# Patient Record
Sex: Female | Born: 1940
Health system: Southern US, Community
[De-identification: ages and names within clinical notes are randomized; demographics above are authoritative.]

## PROBLEM LIST (undated history)

## (undated) DIAGNOSIS — Y92009 Unspecified place in unspecified non-institutional (private) residence as the place of occurrence of the external cause: Secondary | ICD-10-CM

## (undated) DIAGNOSIS — B029 Zoster without complications: Secondary | ICD-10-CM

## (undated) DIAGNOSIS — R51 Headache: Secondary | ICD-10-CM

## (undated) DIAGNOSIS — R519 Headache, unspecified: Secondary | ICD-10-CM

## (undated) DIAGNOSIS — W19XXXA Unspecified fall, initial encounter: Secondary | ICD-10-CM

## (undated) HISTORY — DX: Unspecified place in unspecified non-institutional (private) residence as the place of occurrence of the external cause: Y92.009

## (undated) HISTORY — PX: ABDOMINAL HYSTERECTOMY: SHX81

## (undated) HISTORY — DX: Unspecified fall, initial encounter: W19.XXXA

## (undated) HISTORY — DX: Headache: R51

## (undated) HISTORY — PX: CHOLECYSTECTOMY: SHX55

## (undated) HISTORY — DX: Headache, unspecified: R51.9

## (undated) HISTORY — PX: TOE SURGERY: SHX1073

---

## 2006-06-01 LAB — HM COLONOSCOPY

## 2011-11-05 ENCOUNTER — Emergency Department (HOSPITAL_BASED_OUTPATIENT_CLINIC_OR_DEPARTMENT_OTHER): Payer: BC Managed Care – PPO

## 2011-11-05 ENCOUNTER — Encounter (HOSPITAL_BASED_OUTPATIENT_CLINIC_OR_DEPARTMENT_OTHER): Payer: Self-pay | Admitting: Emergency Medicine

## 2011-11-05 ENCOUNTER — Emergency Department (HOSPITAL_BASED_OUTPATIENT_CLINIC_OR_DEPARTMENT_OTHER)
Admission: EM | Admit: 2011-11-05 | Discharge: 2011-11-05 | Disposition: A | Discharge status: Home / Self Care | Payer: BC Managed Care – PPO | Attending: Emergency Medicine | Admitting: Emergency Medicine

## 2011-11-05 DIAGNOSIS — W540XXA Bitten by dog, initial encounter: Secondary | ICD-10-CM | POA: Insufficient documentation

## 2011-11-05 DIAGNOSIS — S61409A Unspecified open wound of unspecified hand, initial encounter: Secondary | ICD-10-CM | POA: Insufficient documentation

## 2011-11-05 DIAGNOSIS — Y92009 Unspecified place in unspecified non-institutional (private) residence as the place of occurrence of the external cause: Secondary | ICD-10-CM | POA: Insufficient documentation

## 2011-11-05 DIAGNOSIS — T148XXA Other injury of unspecified body region, initial encounter: Secondary | ICD-10-CM

## 2011-11-05 HISTORY — DX: Zoster without complications: B02.9

## 2011-11-05 MED ORDER — AMOXICILLIN-POT CLAVULANATE 875-125 MG PO TABS: 1.0000 | ORAL_TABLET | Freq: Two times a day (BID) | ORAL | Status: AC

## 2011-11-05 MED ORDER — TETANUS-DIPHTH-ACELL PERTUSSIS 5-2.5-18.5 LF-MCG/0.5 IM SUSP
0.5000 mL | Freq: Once | INTRAMUSCULAR | Status: AC
  Administered 2011-11-05: 0.5 mL via INTRAMUSCULAR
  Filled 2011-11-05: qty 0.5

## 2011-11-05 NOTE — ED Provider Notes (Signed)
History     CSN: [id number]  Arrival date & time 11/05/11  2022   First MD Initiated Contact with Patient 11/05/11 2158      Chief Complaint  Patient presents with  . Animal Bite    (Consider location/radiation/quality/duration/timing/severity/associated sxs/prior treatment) Patient is a 71 y.o. female presenting with hand injury. The history is provided by the patient.  Hand Injury  The incident occurred 1 to 2 hours ago. The incident occurred at home. Injury mechanism: a dog bite. The pain is present in the left hand. The quality of the pain is described as aching. The pain is at a severity of 4/10. The pain is moderate. It is unknown if a foreign body is present. She has tried nothing for the symptoms.  Ptreports her neighbors dog bite her hand.  Pt has swelling to 3rd finger,    Past Medical History  Diagnosis Date  . Shingles     Past Surgical History  Procedure Date  . Abdominal hysterectomy   . Cholecystectomy     History reviewed. No pertinent family history.  History  Substance Use Topics  . Smoking status: Never Smoker   . Smokeless tobacco: Not on file  . Alcohol Use: No    OB History    Grav Para Term Preterm Abortions TAB SAB Ect Mult Living                  Review of Systems  Musculoskeletal: Positive for joint swelling.  Skin: Positive for wound.  All other systems reviewed and are negative.    Allergies  Review of patient's allergies indicates no known allergies.  Home Medications   Current Outpatient Rx  Name Route Sig Dispense Refill  . DIPHENHYDRAMINE-APAP (SLEEP) 25-500 MG PO TABS Oral Take 1 tablet by mouth at bedtime as needed. For sleep.    Marland Kitchen GABAPENTIN 300 MG PO CAPS Oral Take 300 mg by mouth 3 (three) times daily.      BP 121/56  Pulse 96  Resp 14  Ht 5\' 1"  (1.549 m)  Wt 130 lb (58.968 kg)  BMI 24.56 kg/m2  SpO2 100%  Physical Exam  Nursing note and vitals reviewed. Constitutional: She is oriented to person, place, and  time. She appears well-developed and well-nourished.  HENT:  Head: Normocephalic.  Musculoskeletal: She exhibits tenderness.       punture wound left hand at base of 3rd finger, no gapping.  Neurological: She is alert and oriented to person, place, and time. She has normal reflexes.  Skin: There is erythema.  Psychiatric: She has a normal mood and affect.    ED Course  Procedures (including critical care time)  Labs Reviewed - No data to display No results found.   1. Animal bite   No results found for this or any previous visit. Dg Hand Complete Left  11/05/2011  *RADIOLOGY REPORT*  Clinical Data: Animal bite to left thumb and index finger  LEFT HAND - COMPLETE 3+ VIEW  Comparison: None  Findings: Osseous demineralization. Joint spaces preserved. No acute fracture, dislocation, or bone destruction. No radiopaque foreign body identified. Tiny calcific densities at the IP joint of the left thumb are old in appearance.  IMPRESSION: No definite acute osseous abnormalities.   Original Report Authenticated By: Lollie Marrow, M.D.        MDM  Pt given tetanus,  rx for augmentin,         Elson Areas, PA 11/05/11 2205  Lonia Skinner [place],  PA 11/05/11 2224

## 2011-11-05 NOTE — ED Notes (Signed)
Dog bite to left hand 

## 2011-11-07 NOTE — ED Provider Notes (Signed)
Medical screening examination/treatment/procedure(s) were conducted as a shared visit with non-physician practitioner(s) and myself.  I personally evaluated the patient during the encounter   Cyndra Numbers, MD 11/07/11 [phone]

## 2015-03-09 ENCOUNTER — Encounter (HOSPITAL_BASED_OUTPATIENT_CLINIC_OR_DEPARTMENT_OTHER): Payer: Self-pay | Admitting: Emergency Medicine

## 2015-03-09 ENCOUNTER — Emergency Department (HOSPITAL_BASED_OUTPATIENT_CLINIC_OR_DEPARTMENT_OTHER): Payer: PPO

## 2015-03-09 ENCOUNTER — Emergency Department (HOSPITAL_BASED_OUTPATIENT_CLINIC_OR_DEPARTMENT_OTHER)
Admission: EM | Admit: 2015-03-09 | Discharge: 2015-03-09 | Disposition: A | Discharge status: Home / Self Care | Payer: PPO | Attending: Emergency Medicine | Admitting: Emergency Medicine

## 2015-03-09 DIAGNOSIS — Y998 Other external cause status: Secondary | ICD-10-CM | POA: Insufficient documentation

## 2015-03-09 DIAGNOSIS — R112 Nausea with vomiting, unspecified: Secondary | ICD-10-CM | POA: Diagnosis not present

## 2015-03-09 DIAGNOSIS — Y92002 Bathroom of unspecified non-institutional (private) residence single-family (private) house as the place of occurrence of the external cause: Secondary | ICD-10-CM | POA: Insufficient documentation

## 2015-03-09 DIAGNOSIS — W1839XA Other fall on same level, initial encounter: Secondary | ICD-10-CM | POA: Insufficient documentation

## 2015-03-09 DIAGNOSIS — Y9389 Activity, other specified: Secondary | ICD-10-CM | POA: Insufficient documentation

## 2015-03-09 DIAGNOSIS — R197 Diarrhea, unspecified: Secondary | ICD-10-CM | POA: Diagnosis not present

## 2015-03-09 DIAGNOSIS — S0012XA Contusion of left eyelid and periocular area, initial encounter: Secondary | ICD-10-CM | POA: Insufficient documentation

## 2015-03-09 DIAGNOSIS — S0990XA Unspecified injury of head, initial encounter: Secondary | ICD-10-CM | POA: Insufficient documentation

## 2015-03-09 DIAGNOSIS — R1033 Periumbilical pain: Secondary | ICD-10-CM | POA: Diagnosis not present

## 2015-03-09 DIAGNOSIS — R55 Syncope and collapse: Secondary | ICD-10-CM | POA: Insufficient documentation

## 2015-03-09 DIAGNOSIS — Z8619 Personal history of other infectious and parasitic diseases: Secondary | ICD-10-CM | POA: Diagnosis not present

## 2015-03-09 DIAGNOSIS — Z79899 Other long term (current) drug therapy: Secondary | ICD-10-CM | POA: Diagnosis not present

## 2015-03-09 DIAGNOSIS — S0512XA Contusion of eyeball and orbital tissues, left eye, initial encounter: Secondary | ICD-10-CM | POA: Diagnosis not present

## 2015-03-09 LAB — LIPASE, BLOOD: Lipase: 26 U/L (ref 11–51)

## 2015-03-09 LAB — COMPREHENSIVE METABOLIC PANEL
ALK PHOS: 72 U/L (ref 38–126)
ALT: 32 U/L (ref 14–54)
AST: 35 U/L (ref 15–41)
Albumin: 4.2 g/dL (ref 3.5–5.0)
Anion gap: 6 (ref 5–15)
BUN: 17 mg/dL (ref 6–20)
CALCIUM: 9 mg/dL (ref 8.9–10.3)
CO2: 25 mmol/L (ref 22–32)
CREATININE: 0.88 mg/dL (ref 0.44–1.00)
Chloride: 106 mmol/L (ref 101–111)
GFR calc non Af Amer: >60 mL/min (ref >60)
GLUCOSE: 112 mg/dL — AB (ref 65–99)
Potassium: 3.8 mmol/L (ref 3.5–5.1)
SODIUM: 137 mmol/L (ref 135–145)
Total Bilirubin: 1 mg/dL (ref 0.3–1.2)
Total Protein: 7.1 g/dL (ref 6.5–8.1)

## 2015-03-09 LAB — CBC WITH DIFFERENTIAL/PLATELET
BASOS ABS: 0 10*3/uL (ref 0.0–0.1)
Basophils Relative: 0 %
EOS ABS: 0 10*3/uL (ref 0.0–0.7)
Eosinophils Relative: 0 %
HCT: 45.2 % (ref 36.0–46.0)
HEMOGLOBIN: 14.6 g/dL (ref 12.0–15.0)
LYMPHS ABS: 0.6 10*3/uL — AB (ref 0.7–4.0)
LYMPHS PCT: 6 %
MCH: 28.3 pg (ref 26.0–34.0)
MCHC: 32.3 g/dL (ref 30.0–36.0)
MCV: 87.6 fL (ref 78.0–100.0)
Monocytes Absolute: 0.5 10*3/uL (ref 0.1–1.0)
Monocytes Relative: 6 %
NEUTROS PCT: 88 %
Neutro Abs: 7.8 10*3/uL — ABNORMAL HIGH (ref 1.7–7.7)
Platelets: 227 10*3/uL (ref 150–400)
RBC: 5.16 MIL/uL — AB (ref 3.87–5.11)
RDW: 14.3 % (ref 11.5–15.5)
WBC: 8.9 10*3/uL (ref 4.0–10.5)

## 2015-03-09 LAB — TROPONIN I

## 2015-03-09 MED ORDER — ONDANSETRON HCL 4 MG PO TABS: 4.0000 mg | ORAL_TABLET | Freq: Four times a day (QID) | ORAL | Status: DC

## 2015-03-09 MED ORDER — SODIUM CHLORIDE 0.9 % IV BOLUS (SEPSIS)
1000.0000 mL | Freq: Once | INTRAVENOUS | Status: AC
  Administered 2015-03-09: 1000 mL via INTRAVENOUS

## 2015-03-09 MED ORDER — ACETAMINOPHEN 325 MG PO TABS
650.0000 mg | ORAL_TABLET | Freq: Once | ORAL | Status: AC
  Administered 2015-03-09: 650 mg via ORAL
  Filled 2015-03-09: qty 2

## 2015-03-09 MED ORDER — ONDANSETRON HCL 4 MG/2ML IJ SOLN
4.0000 mg | Freq: Once | INTRAMUSCULAR | Status: AC
  Administered 2015-03-09: 4 mg via INTRAVENOUS
  Filled 2015-03-09: qty 2

## 2015-03-09 NOTE — ED Notes (Signed)
Pt in c/o abd pain and n/v/d onset last night. States was on the toilet today and passed out and woke up on the floor, bruising noted to L eye. Denies taking blood thinners.

## 2015-03-09 NOTE — ED Provider Notes (Signed)
CSN: [id number]     Arrival date & time 03/09/15  1704 History   First MD Initiated Contact with Patient 03/09/15 1711     Chief Complaint  Patient presents with  . Emesis  . Fall     (Consider location/radiation/quality/duration/timing/severity/associated sxs/prior Treatment) HPI Comments: Patient is a 75 year old healthy female who takes no medications and presents today with 2 complaints. Approximately 10 PM last night patient started to have vomiting which continued throughout the night. At 6 AM this morning when she was in the bathroom sitting on the toilet vomiting into a trash can she suddenly became sweaty, lightheaded and had a syncopal events causing her to hit her left eye on the vanity as she fell to the floor. Since that time she has had ongoing vomiting which ceased approximately 2 hours ago however now she is also having diarrhea. She has mild diffuse abdominal soreness but denies any localized pain. No blood in the emesis or diarrhea. She is complaining of a headache that is generalized across the floor head but denies vision changes, numbness, weakness, chest pain, shortness of breath, palpitations. She has had no difficulty ambulating.  Patient is a 75 y.o. female presenting with vomiting and fall. The history is provided by the patient.  Emesis Fall    Past Medical History  Diagnosis Date  . Shingles    Past Surgical History  Procedure Laterality Date  . Abdominal hysterectomy    . Cholecystectomy     History reviewed. No pertinent family history. Social History  Substance Use Topics  . Smoking status: Never Smoker   . Smokeless tobacco: None  . Alcohol Use: No   OB History    No data available     Review of Systems  Gastrointestinal: Positive for vomiting.  All other systems reviewed and are negative.     Allergies  Review of patient's allergies indicates no known allergies.  Home Medications   Prior to Admission medications   Medication Sig  Start Date End Date Taking? Authorizing Provider  diphenhydramine-acetaminophen (TYLENOL PM) 25-500 MG TABS Take 1 tablet by mouth at bedtime as needed. For sleep.    Historical Provider, MD  gabapentin (NEURONTIN) 300 MG capsule Take 300 mg by mouth 3 (three) times daily.    Historical Provider, MD   BP 116/64 mmHg  Pulse 77  Temp(Src) 97.9 F (36.6 C) (Oral)  Resp 9  Ht 5\' 1"  (1.549 m)  Wt 133 lb (60.328 kg)  BMI 25.14 kg/m2  SpO2 97% Physical Exam  Constitutional: She is oriented to person, place, and time. She appears well-developed and well-nourished. No distress.  HENT:  Head: Normocephalic. Head is with contusion.    Mouth/Throat: Oropharynx is clear and moist.  Tenderness and ecchymosis over the left orbit  Eyes: Conjunctivae and EOM are normal. Pupils are equal, round, and reactive to light.  Neck: Normal range of motion. Neck supple. No spinous process tenderness and no muscular tenderness present.  Cardiovascular: Normal rate, regular rhythm and intact distal pulses.   No murmur heard. Pulmonary/Chest: Effort normal and breath sounds normal. No respiratory distress. She has no wheezes. She has no rales.  Abdominal: Soft. She exhibits no distension. There is tenderness. There is no rebound and no guarding.  Abdomen is soft and mild periumbilical tenderness but rebound or guarding  Musculoskeletal: Normal range of motion. She exhibits no edema or tenderness.  Neurological: She is alert and oriented to person, place, and time.  Skin: Skin is warm and dry.  No rash noted. No erythema.  Psychiatric: She has a normal mood and affect. Her behavior is normal.  Nursing note and vitals reviewed.   ED Course  Procedures (including critical care time) Labs Review Labs Reviewed  CBC WITH DIFFERENTIAL/PLATELET - Abnormal; Notable for the following:    RBC 5.16 (*)    Neutro Abs 7.8 (*)    Lymphs Abs 0.6 (*)    All other components within normal limits  COMPREHENSIVE METABOLIC  PANEL - Abnormal; Notable for the following:    Glucose, Bld 112 (*)    All other components within normal limits  LIPASE, BLOOD  TROPONIN I    Imaging Review Ct Head Wo Contrast  03/09/2015  CLINICAL DATA:  Patient passed out today falling to the floor bruising the left orbital region. EXAM: CT HEAD WITHOUT CONTRAST CT MAXILLOFACIAL WITHOUT CONTRAST TECHNIQUE: Multidetector CT imaging of the head and maxillofacial structures were performed using the standard protocol without intravenous contrast. Multiplanar CT image reconstructions of the maxillofacial structures were also generated. COMPARISON:  None. FINDINGS: CT HEAD FINDINGS The ventricles are normal in size and configuration. There are no parenchymal masses or mass effect, no evidence of infarct, no extra-axial masses or abnormal fluid collections and no intracranial hemorrhage. The visualized sinuses and mastoid air cells clear. No skull fracture. CT MAXILLOFACIAL FINDINGS There is mild left periorbital, preseptal soft tissue swelling. No abnormality of the left globe or postseptal orbit. There are no fractures. The sinuses, middle ear cavities and mastoid air cells are clear. No soft tissue masses or adenopathy. IMPRESSION: HEAD CT:  No intracranial abnormality.  No skull fracture. MAXILLOFACIAL CT:  No fractures. Electronically Signed   By: Amie Portland M.D.   On: 03/09/2015 18:08   Ct Maxillofacial Wo Cm  03/09/2015  CLINICAL DATA:  Patient passed out today falling to the floor bruising the left orbital region. EXAM: CT HEAD WITHOUT CONTRAST CT MAXILLOFACIAL WITHOUT CONTRAST TECHNIQUE: Multidetector CT imaging of the head and maxillofacial structures were performed using the standard protocol without intravenous contrast. Multiplanar CT image reconstructions of the maxillofacial structures were also generated. COMPARISON:  None. FINDINGS: CT HEAD FINDINGS The ventricles are normal in size and configuration. There are no parenchymal masses or  mass effect, no evidence of infarct, no extra-axial masses or abnormal fluid collections and no intracranial hemorrhage. The visualized sinuses and mastoid air cells clear. No skull fracture. CT MAXILLOFACIAL FINDINGS There is mild left periorbital, preseptal soft tissue swelling. No abnormality of the left globe or postseptal orbit. There are no fractures. The sinuses, middle ear cavities and mastoid air cells are clear. No soft tissue masses or adenopathy. IMPRESSION: HEAD CT:  No intracranial abnormality.  No skull fracture. MAXILLOFACIAL CT:  No fractures. Electronically Signed   By: Amie Portland M.D.   On: 03/09/2015 18:08   I have personally reviewed and evaluated these images and lab results as part of my medical decision-making.   EKG Interpretation   Date/Time:  Saturday March 09 2015 17:27:44 EST Ventricular Rate:  85 PR Interval:  146 QRS Duration: 104 QT Interval:  381 QTC Calculation: 453 R Axis:   -56 Text Interpretation:  Sinus rhythm Incomplete RBBB and LAFB Low voltage,  precordial leads Consider right ventricular hypertrophy No previous  tracing Confirmed by Anitra Lauth  MD, Alphonzo Lemmings ([id number]) on 03/09/2015 5:35:43 PM      MDM   Final diagnoses:  Vasovagal syncope  Nausea vomiting and diarrhea   Pt with symptoms most consistent with a  viral process with  + sick contacts, vomitting/diarrhea.  Denies bad food exposure and recent travel out of the country.  No recent abx.  No hx concerning for GU pathology or kidney stones.  Pt is awake and alert on exam without peritoneal signs.  Syncope seems to be related to a vagal event while vomiting today.  Denies CP, SOB or palpitations.  Pt is healthy and takes no meds.  Denies any chest pain since the event and no neuro sx.  She is able to ambulate without difficulty and denies any vision changes.  Pt VS wnl.  Pain over the orbit but normal neuro exam.  Pt given IVF and zofran.  CT of the head and face, CBC, CMP, lipase and troponin  pending  6:44 PM Labs and imaging wnl.  Pt improved after fluids and zofran.  Pt tolerating po's and requesting to go home.    Gwyneth SproutWhitney Raevyn Sokol, MD 03/09/15 1845

## 2015-05-21 DIAGNOSIS — Z79899 Other long term (current) drug therapy: Secondary | ICD-10-CM | POA: Diagnosis not present

## 2015-05-21 DIAGNOSIS — I10 Essential (primary) hypertension: Secondary | ICD-10-CM | POA: Diagnosis not present

## 2015-05-21 DIAGNOSIS — E559 Vitamin D deficiency, unspecified: Secondary | ICD-10-CM | POA: Diagnosis not present

## 2015-05-21 DIAGNOSIS — Z0000 Encounter for general adult medical examination without abnormal findings: Secondary | ICD-10-CM | POA: Diagnosis not present

## 2015-05-29 ENCOUNTER — Encounter: Payer: Self-pay | Admitting: Neurology

## 2015-05-29 ENCOUNTER — Ambulatory Visit (INDEPENDENT_AMBULATORY_CARE_PROVIDER_SITE_OTHER): Payer: PPO | Admitting: Neurology

## 2015-05-29 VITALS — BP 120/72 | HR 64 | Resp 14 | Ht 61.0 in | Wt 138.0 lb

## 2015-05-29 DIAGNOSIS — F0781 Postconcussional syndrome: Secondary | ICD-10-CM | POA: Diagnosis not present

## 2015-05-29 DIAGNOSIS — H9192 Unspecified hearing loss, left ear: Secondary | ICD-10-CM | POA: Diagnosis not present

## 2015-05-29 DIAGNOSIS — H8110 Benign paroxysmal vertigo, unspecified ear: Secondary | ICD-10-CM

## 2015-05-29 DIAGNOSIS — G250 Essential tremor: Secondary | ICD-10-CM | POA: Diagnosis not present

## 2015-05-29 DIAGNOSIS — H9202 Otalgia, left ear: Secondary | ICD-10-CM | POA: Diagnosis not present

## 2015-05-29 DIAGNOSIS — R42 Dizziness and giddiness: Secondary | ICD-10-CM | POA: Insufficient documentation

## 2015-05-29 MED ORDER — METHYLPREDNISOLONE 4 MG PO TBPK
ORAL_TABLET | ORAL | Status: DC
Start: 2015-05-29 — End: 2016-06-03

## 2015-05-29 MED ORDER — MECLIZINE HCL 25 MG PO TABS: 25.0000 mg | ORAL_TABLET | Freq: Three times a day (TID) | ORAL | Status: DC | PRN

## 2015-05-29 NOTE — Progress Notes (Signed)
[facility]    Provider:  Dr Lucia GaskinsAhern Referring Provider: Aida PufferLittle, James, MD Primary Care Physician:  Aida PufferLITTLE,JAMES, MD  CC:  Head tremor  HPI:  Caroline Sandoval is a 75 y.o. female here as a referral from Dr. Clarene DukeLittle for head tremors and +romberg. Without significant PMHx.  The head tremors have been going on for years. Not really worsening. Father also had the head tremor. Not in the voice. Sometimes in the hands and handwriting jerky, no resting tremor. She had the flu inJanuary and she passed out and hit her head. She hit the left side of her head and had a black eye. Since then she is dizzy from time to time. If she went to bed and turned over on the right side the room would spin. She mentioned it to Dr. Clarene DukeLittle and noticed it is wobbling. The room spinning is getting better. She occasionally roll over in bed and she gets vertigo but improving. She gets up sometimes in the morning and feel the vertigo and can't walk straight. She is having ear issues and ear aches. She has hearing difficulty. The left ear is bothering her, ear aches. She has not been to ENT to check her inner ear and no recent hearing test.  Worsening over the last several years. She can't hear as well. No noises in the ears. No drainage from the ears. Years ago previous to this incident she had vertigo so bad she had to go to bed, she was given medication and it resolved but never had it again until she had the flu in January and then fell and hit her head because she had the flu and she was dehydrated and sick and fell and hit her head. The vertigo started after hitting her head. Improving. No numbness or tingling in the toes. Balance problems have come after she hit her head with a recent +Romberg in the office. She also had a headache after hitting her head and that has gotten better. She still gets headaches every day concentrated on the left side of the head from where she hit her head.   Reviewed notes, labs  and imaging from outside physicians, which showed:Reviewed dr Fredirick MaudlinLittle's notes. He notes a +Romberg on exam. Familial head tremor and feelings of dizziness.   CT of the head unremarkable. Personally reviewed.  03/09/2015:  CT MAXILLOFACIAL FINDINGS  There is mild left periorbital, preseptal soft tissue swelling. No abnormality of the left globe or postseptal orbit.  There are no fractures. The sinuses, middle ear cavities and mastoid air cells are clear.  No soft tissue masses or adenopathy.  IMPRESSION: HEAD CT: No intracranial abnormality. No skull fracture.  MAXILLOFACIAL CT: No fractures.    Review of Systems: Patient complains of symptoms per HPI as well as the following symptoms:weight gain, fatigue, headache, dizziness. Pertinent negatives per HPI. All others negative.   Social History   Social History  . Marital Status: Married    Spouse Name: N/A  . Number of Children: 2  . Years of Education: College   Occupational History  . Retired     Social History Main Topics  . Smoking status: Never Smoker   . Smokeless tobacco: Not on file  . Alcohol Use: No  . Drug Use: No  . Sexual Activity: Not on file   Other Topics Concern  . Not on file   Social History Narrative   Drinks about 1 cup of coffee a day, may drink a soda during the  day     Family History  Problem Relation Age of Onset  . Pulmonary fibrosis Mother   . Bladder Cancer Father   . Heart attack Brother   . Pulmonary fibrosis Brother   . Rheum arthritis Brother   . Neuropathy Neg Hx   . Stroke Neg Hx   . Ataxia Neg Hx     Past Medical History  Diagnosis Date  . Shingles   . Headache     Past Surgical History  Procedure Laterality Date  . Abdominal hysterectomy    . Cholecystectomy      Current Outpatient Prescriptions  Medication Sig Dispense Refill  . meclizine (ANTIVERT) 25 MG tablet Take 1 tablet (25 mg total) by mouth 3 (three) times daily as needed for dizziness. 30  tablet 6  . methylPREDNISolone (MEDROL DOSEPAK) 4 MG TBPK tablet follow package directions 21 tablet 11   No current facility-administered medications for this visit.    Allergies as of 05/29/2015  . (No Known Allergies)    Vitals: BP 120/72 mmHg  Pulse 64  Resp 14  Ht  (1.549 m)  Wt 138 lb (62.596 kg)  BMI 26.09 kg/m2 Last Weight:  Wt Readings from Last 1 Encounters:  05/29/15 138 lb (62.596 kg)   Last Height:   Ht Readings from Last 1 Encounters:  05/29/15  (1.549 m)    Physical exam: Exam: Gen: NAD, conversant, well nourised, obese, well groomed                     CV: RRR, no MRG. No Carotid Bruits. No peripheral edema, warm, nontender Eyes: Conjunctivae clear without exudates or hemorrhage  Neuro: Detailed Neurologic Exam  Speech:    Speech is normal; fluent and spontaneous with normal comprehension.  Cognition:    The patient is oriented to person, place, and time;     recent and remote memory intact;     language fluent;     normal attention, concentration,     fund of knowledge Cranial Nerves:    The pupils are equal, round, and reactive to light. The fundi are normal and spontaneous venous pulsations are present. Visual fields are full to finger confrontation. Extraocular movements are intact. Trigeminal sensation is intact and the muscles of mastication are normal. The face is symmetric. The palate elevates in the midline. Hearing intact. Voice is normal. Shoulder shrug is normal. The tongue has normal motion without fasciculations.   Coordination:    Normal finger to nose and heel to shin. Normal rapid alternating movements.   Gait:    Heel-toe and tandem gait are normal.  Mild imbalance with tandem.   Motor Observation:    No asymmetry, no atrophy, very mild postural and head tremor. Tone:    Normal muscle tone.    Posture:    Posture is normal. normal erect    Strength:    Strength is V/V in the upper and lower limbs.        Sensation: intact to LT, pin prick, vibration, proprioception. Mild sway when testing Romberg but no fall.      Reflex Exam:  DTR's:    Deep tendon reflexes in the upper and lower extremities are normal bilaterally.   Toes:    The toes are downgoing bilaterally.   Clonus:    Clonus is absent.      Assessment/Plan:  Very lovely 75 year old female with no significant PMHx here for vertigo, dizziness, tremor.  Tremor: Essential tremor. Will follow clinically Dizziness and headache: post-concussive. Steroid dosepak. Meclizine prn. Impriving. Discussed concussions, takes time. Hearing loss and ear pain and BPPV: ENT evaluation. Vestibular therapy.  Provided UpToDate literature on tremors(essential) and post-concussive symptoms and BPPV Will request lab results from dr little, she just had a lab panel completed.   Naomie Dean, MD  [facility] [address] [place], [place] [address]  Phone [phone] Fax [phone]

## 2015-05-29 NOTE — Patient Instructions (Signed)
Remember to drink plenty of fluid, eat healthy meals and do not skip any meals. Try to eat protein with a every meal and eat a healthy snack such as fruit or nuts in between meals. Try to keep a regular sleep-wake schedule and try to exercise daily, particularly in the form of walking, 20-30 minutes a day, if you can.   As far as your medications are concerned, I would like to suggest: ENT evaluation Dr. Haroldine Lawsrossley Vestibular therapy Medrol dosepak Meclizine as needed for dizziness and vertigo   As far as diagnostic testing:   I would like to see you back as needed, sooner if we need to. Please call us with any interim questions, concerns, problems, updates or refill requests.   Our phone number is [phone]. We also have an after hours call service for urgent matters and there is a physician on-call for urgent questions. For any emergencies you know to call 911 or go to the nearest emergency room

## 2015-06-11 DIAGNOSIS — H9042 Sensorineural hearing loss, unilateral, left ear, with unrestricted hearing on the contralateral side: Secondary | ICD-10-CM | POA: Diagnosis not present

## 2015-06-11 DIAGNOSIS — H8142 Vertigo of central origin, left ear: Secondary | ICD-10-CM | POA: Diagnosis not present

## 2015-07-18 ENCOUNTER — Ambulatory Visit: Payer: PPO | Admitting: Audiology

## 2015-08-01 LAB — HM MAMMOGRAPHY: HM Mammogram: NORMAL (ref 0–4)

## 2015-09-09 DIAGNOSIS — H527 Unspecified disorder of refraction: Secondary | ICD-10-CM | POA: Diagnosis not present

## 2015-09-09 DIAGNOSIS — H353121 Nonexudative age-related macular degeneration, left eye, early dry stage: Secondary | ICD-10-CM | POA: Diagnosis not present

## 2015-09-10 DIAGNOSIS — Z1231 Encounter for screening mammogram for malignant neoplasm of breast: Secondary | ICD-10-CM | POA: Diagnosis not present

## 2016-04-17 ENCOUNTER — Telehealth: Payer: Self-pay | Admitting: Family Medicine

## 2016-04-17 NOTE — Telephone Encounter (Addendum)
°  Relation to XL:KGMWpt:self Call back number: [phone] Pharmacy: [id number] - [place], [place] - [address] [facility] [phone]) [phone] (Fax)     Reason for call:  Patient experiencing ongoing cough and would like to see PCP for acute only. New patient appointment scheduled for 06/04/2016. Please advise

## 2016-04-17 NOTE — Telephone Encounter (Signed)
That is fine 

## 2016-04-20 NOTE — Telephone Encounter (Signed)
Patient contacted and states cough has improved.

## 2016-06-03 ENCOUNTER — Encounter: Payer: Self-pay | Admitting: Behavioral Health

## 2016-06-03 ENCOUNTER — Telehealth: Payer: Self-pay | Admitting: Behavioral Health

## 2016-06-03 NOTE — Telephone Encounter (Signed)
Pre-Visit Call completed with patient and chart updated.   Pre-Visit Info documented in Specialty Comments under [employer].    

## 2016-06-04 ENCOUNTER — Ambulatory Visit (INDEPENDENT_AMBULATORY_CARE_PROVIDER_SITE_OTHER): Payer: PPO | Admitting: Family Medicine

## 2016-06-04 ENCOUNTER — Encounter: Payer: Self-pay | Admitting: Family Medicine

## 2016-06-04 VITALS — BP 122/70 | HR 68 | Temp 97.8°F | Ht 61.0 in | Wt 135.0 lb

## 2016-06-04 DIAGNOSIS — F329 Major depressive disorder, single episode, unspecified: Secondary | ICD-10-CM | POA: Diagnosis not present

## 2016-06-04 DIAGNOSIS — E2839 Other primary ovarian failure: Secondary | ICD-10-CM

## 2016-06-04 MED ORDER — FLUOXETINE HCL 20 MG PO TABS: 20.0000 mg | ORAL_TABLET | Freq: Every day | ORAL | 5 refills | Status: DC

## 2016-06-04 NOTE — Patient Instructions (Addendum)
It was a pleasure to meet you both today!  I am sorry that Delma's health is not good.  You had mentioned wanting to travel - you might be interested in contacting [facility] (out of [place]) to discuss getting some help so you can take a trip together!   [facility] [phone] By Mail: [email or web address] [address] [address] [place], [place] [address] Or Email [place] at: [email or web address]  I will set you up for a bone density test here at the [facility] For depression and stress, I would like to have you try a low dose of prozac (fluoxetine)- start with 20 mg once a day. My hope is that over the next 2-4 weeks you will feel less overwhelmed and sad overall.    Please see me in 2-3 months for a physical and labs
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
## 2016-06-04 NOTE — Progress Notes (Signed)
Pre visit review using our clinic review tool, if applicable. No additional management support is needed unless otherwise documented below in the visit note. 

## 2016-06-08 DIAGNOSIS — R1310 Dysphagia, unspecified: Secondary | ICD-10-CM | POA: Diagnosis not present

## 2016-06-08 DIAGNOSIS — Z1211 Encounter for screening for malignant neoplasm of colon: Secondary | ICD-10-CM | POA: Diagnosis not present

## 2016-06-09 ENCOUNTER — Ambulatory Visit (HOSPITAL_BASED_OUTPATIENT_CLINIC_OR_DEPARTMENT_OTHER)
Admission: RE | Admit: 2016-06-09 | Discharge: 2016-06-09 | Disposition: A | Discharge status: Home / Self Care | Payer: PPO | Source: Ambulatory Visit | Attending: Family Medicine | Admitting: Family Medicine

## 2016-06-09 ENCOUNTER — Encounter: Payer: Self-pay | Admitting: Family Medicine

## 2016-06-09 DIAGNOSIS — M85851 Other specified disorders of bone density and structure, right thigh: Secondary | ICD-10-CM | POA: Diagnosis not present

## 2016-06-09 DIAGNOSIS — M8589 Other specified disorders of bone density and structure, multiple sites: Secondary | ICD-10-CM | POA: Insufficient documentation

## 2016-06-09 DIAGNOSIS — Z13820 Encounter for screening for osteoporosis: Secondary | ICD-10-CM | POA: Insufficient documentation

## 2016-06-09 DIAGNOSIS — E2839 Other primary ovarian failure: Secondary | ICD-10-CM | POA: Diagnosis not present

## 2016-06-09 DIAGNOSIS — M8580 Other specified disorders of bone density and structure, unspecified site: Secondary | ICD-10-CM | POA: Insufficient documentation

## 2016-06-09 NOTE — Progress Notes (Signed)
Called pt to discuss.  Her hip fracture risk is over 3%- would recommend medication.  However for now she does not wish to start medication, she will continue to exercise, get calcium and vitamin D and we will plan to repeat in about one year

## 2016-06-12 DIAGNOSIS — K222 Esophageal obstruction: Secondary | ICD-10-CM | POA: Diagnosis not present

## 2016-06-12 DIAGNOSIS — Z1211 Encounter for screening for malignant neoplasm of colon: Secondary | ICD-10-CM | POA: Diagnosis not present

## 2016-06-12 DIAGNOSIS — K253 Acute gastric ulcer without hemorrhage or perforation: Secondary | ICD-10-CM | POA: Diagnosis not present

## 2016-06-12 DIAGNOSIS — K2950 Unspecified chronic gastritis without bleeding: Secondary | ICD-10-CM | POA: Diagnosis not present

## 2016-06-12 DIAGNOSIS — K449 Diaphragmatic hernia without obstruction or gangrene: Secondary | ICD-10-CM | POA: Diagnosis not present

## 2016-06-12 DIAGNOSIS — R1310 Dysphagia, unspecified: Secondary | ICD-10-CM | POA: Diagnosis not present

## 2016-06-12 DIAGNOSIS — K5730 Diverticulosis of large intestine without perforation or abscess without bleeding: Secondary | ICD-10-CM | POA: Diagnosis not present

## 2016-06-12 DIAGNOSIS — K648 Other hemorrhoids: Secondary | ICD-10-CM | POA: Diagnosis not present

## 2016-06-12 DIAGNOSIS — K209 Esophagitis, unspecified: Secondary | ICD-10-CM | POA: Diagnosis not present

## 2016-06-12 DIAGNOSIS — B9681 Helicobacter pylori [H. pylori] as the cause of diseases classified elsewhere: Secondary | ICD-10-CM | POA: Diagnosis not present

## 2016-06-12 DIAGNOSIS — K259 Gastric ulcer, unspecified as acute or chronic, without hemorrhage or perforation: Secondary | ICD-10-CM | POA: Diagnosis not present

## 2016-07-10 ENCOUNTER — Other Ambulatory Visit: Payer: Self-pay | Admitting: Emergency Medicine

## 2016-07-10 DIAGNOSIS — F329 Major depressive disorder, single episode, unspecified: Secondary | ICD-10-CM

## 2016-07-10 MED ORDER — FLUOXETINE HCL 20 MG PO TABS
20.0000 mg | ORAL_TABLET | Freq: Every day | ORAL | 0 refills | Status: DC
Start: 2016-07-10 — End: 2016-08-19

## 2016-08-17 DIAGNOSIS — K2950 Unspecified chronic gastritis without bleeding: Secondary | ICD-10-CM | POA: Diagnosis not present

## 2016-08-17 DIAGNOSIS — K449 Diaphragmatic hernia without obstruction or gangrene: Secondary | ICD-10-CM | POA: Diagnosis not present

## 2016-08-17 DIAGNOSIS — K2930 Chronic superficial gastritis without bleeding: Secondary | ICD-10-CM | POA: Diagnosis not present

## 2016-08-17 DIAGNOSIS — K259 Gastric ulcer, unspecified as acute or chronic, without hemorrhage or perforation: Secondary | ICD-10-CM | POA: Diagnosis not present

## 2016-08-17 DIAGNOSIS — K2970 Gastritis, unspecified, without bleeding: Secondary | ICD-10-CM | POA: Diagnosis not present

## 2016-08-18 DIAGNOSIS — S52512A Displaced fracture of left radial styloid process, initial encounter for closed fracture: Secondary | ICD-10-CM | POA: Diagnosis not present

## 2016-08-18 DIAGNOSIS — M25522 Pain in left elbow: Secondary | ICD-10-CM | POA: Diagnosis not present

## 2016-08-18 DIAGNOSIS — S59902A Unspecified injury of left elbow, initial encounter: Secondary | ICD-10-CM | POA: Diagnosis not present

## 2016-08-18 DIAGNOSIS — S52612A Displaced fracture of left ulna styloid process, initial encounter for closed fracture: Secondary | ICD-10-CM | POA: Diagnosis not present

## 2016-08-18 DIAGNOSIS — W1830XA Fall on same level, unspecified, initial encounter: Secondary | ICD-10-CM | POA: Diagnosis not present

## 2016-08-18 DIAGNOSIS — M25532 Pain in left wrist: Secondary | ICD-10-CM | POA: Diagnosis not present

## 2016-08-18 DIAGNOSIS — R200 Anesthesia of skin: Secondary | ICD-10-CM | POA: Diagnosis not present

## 2016-08-18 DIAGNOSIS — S52502A Unspecified fracture of the lower end of left radius, initial encounter for closed fracture: Secondary | ICD-10-CM | POA: Diagnosis not present
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
## 2016-08-19 ENCOUNTER — Encounter: Payer: Self-pay | Admitting: Family Medicine

## 2016-08-19 ENCOUNTER — Ambulatory Visit (INDEPENDENT_AMBULATORY_CARE_PROVIDER_SITE_OTHER): Payer: PPO | Admitting: Family Medicine

## 2016-08-19 VITALS — BP 124/64 | HR 59 | Temp 97.9°F | Ht 61.0 in | Wt 131.6 lb

## 2016-08-19 DIAGNOSIS — R7309 Other abnormal glucose: Secondary | ICD-10-CM | POA: Diagnosis not present

## 2016-08-19 DIAGNOSIS — Z130 Encounter for screening for diseases of the blood and blood-forming organs and certain disorders involving the immune mechanism: Secondary | ICD-10-CM

## 2016-08-19 DIAGNOSIS — F329 Major depressive disorder, single episode, unspecified: Secondary | ICD-10-CM

## 2016-08-19 DIAGNOSIS — Z0000 Encounter for general adult medical examination without abnormal findings: Secondary | ICD-10-CM

## 2016-08-19 DIAGNOSIS — Z131 Encounter for screening for diabetes mellitus: Secondary | ICD-10-CM | POA: Diagnosis not present

## 2016-08-19 DIAGNOSIS — Z13220 Encounter for screening for lipoid disorders: Secondary | ICD-10-CM | POA: Diagnosis not present

## 2016-08-19 DIAGNOSIS — E2839 Other primary ovarian failure: Secondary | ICD-10-CM

## 2016-08-19 DIAGNOSIS — M8580 Other specified disorders of bone density and structure, unspecified site: Secondary | ICD-10-CM | POA: Diagnosis not present

## 2016-08-19 DIAGNOSIS — Z23 Encounter for immunization: Secondary | ICD-10-CM

## 2016-08-19 DIAGNOSIS — S62102A Fracture of unspecified carpal bone, left wrist, initial encounter for closed fracture: Secondary | ICD-10-CM

## 2016-08-19 LAB — COMPREHENSIVE METABOLIC PANEL
ALBUMIN: 4.3 g/dL (ref 3.5–5.2)
ALK PHOS: 65 U/L (ref 39–117)
ALT: 20 U/L (ref 0–35)
AST: 27 U/L (ref 0–37)
BUN: 11 mg/dL (ref 6–23)
CO2: 25 mEq/L (ref 19–32)
Calcium: 9.7 mg/dL (ref 8.4–10.5)
Chloride: 106 mEq/L (ref 96–112)
Creatinine, Ser: 1.05 mg/dL (ref 0.40–1.20)
GFR: 54.26 mL/min — ABNORMAL LOW (ref >60.00)
Glucose, Bld: 91 mg/dL (ref 70–99)
POTASSIUM: 3.7 meq/L (ref 3.5–5.1)
Sodium: 138 mEq/L (ref 135–145)
TOTAL PROTEIN: 6.7 g/dL (ref 6.0–8.3)
Total Bilirubin: 0.6 mg/dL (ref 0.2–1.2)

## 2016-08-19 LAB — CBC
HCT: 42.1 % (ref 36.0–46.0)
HEMOGLOBIN: 14 g/dL (ref 12.0–15.0)
MCHC: 33.2 g/dL (ref 30.0–36.0)
MCV: 87.1 fl (ref 78.0–100.0)
Platelets: 226 10*3/uL (ref 150.0–400.0)
RBC: 4.83 Mil/uL (ref 3.87–5.11)
RDW: 14.9 % (ref 11.5–15.5)
WBC: 8.5 10*3/uL (ref 4.0–10.5)

## 2016-08-19 LAB — LIPID PANEL
Cholesterol: 224 mg/dL — ABNORMAL HIGH (ref 0–200)
HDL: 57.3 mg/dL (ref >39.00)
LDL Cholesterol: 144 mg/dL — ABNORMAL HIGH (ref 0–99)
NonHDL: 166.37
TRIGLYCERIDES: 111 mg/dL (ref 0.0–149.0)
Total CHOL/HDL Ratio: 4
VLDL: 22.2 mg/dL (ref 0.0–40.0)

## 2016-08-19 LAB — HEMOGLOBIN A1C: HEMOGLOBIN A1C: 5.7 % (ref 4.6–6.5)

## 2016-08-19 LAB — VITAMIN D 25 HYDROXY (VIT D DEFICIENCY, FRACTURES): VITD: 51.46 ng/mL (ref 30.00–100.00)

## 2016-08-19 MED ORDER — IBANDRONATE SODIUM 150 MG PO TABS: 150.0000 mg | ORAL_TABLET | ORAL | 4 refills | Status: DC

## 2016-08-19 MED ORDER — FLUOXETINE HCL 20 MG PO TABS: 20.0000 mg | ORAL_TABLET | Freq: Every day | ORAL | 3 refills | Status: DC

## 2016-08-19 NOTE — Patient Instructions (Addendum)
It was very nice to see you today!  Take care and I will be in touch with your labs asap You got your last pneumonia vaccine (the prevnar 13) today Given your low bone density and recent wrist fracture we will start you on a medication to improve your bone density- boniva, once a month.  However if this is very $$ let me know and we can use once a week fosamax.  Please check with orthopedics prior to starting on this medication.    Continue to use prozac as you are currently - I am glad that this is helping you.

## 2016-08-20 DIAGNOSIS — S52502A Unspecified fracture of the lower end of left radius, initial encounter for closed fracture: Secondary | ICD-10-CM | POA: Diagnosis not present

## 2016-08-28 DIAGNOSIS — S52502D Unspecified fracture of the lower end of left radius, subsequent encounter for closed fracture with routine healing: Secondary | ICD-10-CM | POA: Diagnosis not present

## 2016-09-02 ENCOUNTER — Other Ambulatory Visit: Payer: Self-pay | Admitting: Family Medicine

## 2016-10-12 DIAGNOSIS — S52502D Unspecified fracture of the lower end of left radius, subsequent encounter for closed fracture with routine healing: Secondary | ICD-10-CM | POA: Diagnosis not present

## 2016-10-12 DIAGNOSIS — M25532 Pain in left wrist: Secondary | ICD-10-CM | POA: Diagnosis not present

## 2016-10-15 DIAGNOSIS — S52502S Unspecified fracture of the lower end of left radius, sequela: Secondary | ICD-10-CM | POA: Diagnosis not present

## 2016-10-15 DIAGNOSIS — S52502D Unspecified fracture of the lower end of left radius, subsequent encounter for closed fracture with routine healing: Secondary | ICD-10-CM | POA: Diagnosis not present

## 2016-11-11 DIAGNOSIS — S52502D Unspecified fracture of the lower end of left radius, subsequent encounter for closed fracture with routine healing: Secondary | ICD-10-CM | POA: Diagnosis not present

## 2016-12-02 DIAGNOSIS — S52502D Unspecified fracture of the lower end of left radius, subsequent encounter for closed fracture with routine healing: Secondary | ICD-10-CM | POA: Diagnosis not present

## 2016-12-23 DIAGNOSIS — Z1231 Encounter for screening mammogram for malignant neoplasm of breast: Secondary | ICD-10-CM | POA: Diagnosis not present

## 2016-12-24 ENCOUNTER — Ambulatory Visit (INDEPENDENT_AMBULATORY_CARE_PROVIDER_SITE_OTHER): Payer: PPO | Admitting: Family Medicine

## 2016-12-24 ENCOUNTER — Ambulatory Visit (HOSPITAL_BASED_OUTPATIENT_CLINIC_OR_DEPARTMENT_OTHER)
Admission: RE | Admit: 2016-12-24 | Discharge: 2016-12-24 | Disposition: A | Discharge status: Home / Self Care | Payer: PPO | Source: Ambulatory Visit | Attending: Family Medicine | Admitting: Family Medicine

## 2016-12-24 VITALS — BP 104/62 | HR 66 | Temp 97.6°F | Ht 61.0 in | Wt 128.6 lb

## 2016-12-24 DIAGNOSIS — Z23 Encounter for immunization: Secondary | ICD-10-CM | POA: Diagnosis not present

## 2016-12-24 DIAGNOSIS — M25552 Pain in left hip: Secondary | ICD-10-CM | POA: Diagnosis not present

## 2016-12-24 DIAGNOSIS — M7062 Trochanteric bursitis, left hip: Secondary | ICD-10-CM

## 2016-12-24 MED ORDER — METHYLPREDNISOLONE ACETATE 40 MG/ML IJ SUSP
40.0000 mg | Freq: Once | INTRAMUSCULAR | Status: AC
  Administered 2016-12-24: 40 mg via INTRA_ARTICULAR

## 2016-12-24 NOTE — Patient Instructions (Signed)
You got your flu shot an a left hip injection today- please let me know if this does not help with your hip pain!  Dg Hip Unilat W Or W/o Pelvis 2-3 Views Left  Result Date: 12/24/2016 CLINICAL DATA:  Chronic pain in LEFT hip.  No injury. EXAM: DG HIP (WITH OR WITHOUT PELVIS) 2-3V LEFT COMPARISON:  None. FINDINGS: There is no evidence of hip fracture or dislocation. There is no evidence of arthropathy or other focal bone abnormality. IMPRESSION: Negative. Electronically Signed   By: Elsie StainJohn T Curnes M.D.   On: 12/24/2016 11:28
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
## 2017-01-11 NOTE — Progress Notes (Deleted)
Subjective:   Caroline Sandoval is a 76 y.o. female who presents for an Initial Medicare Annual Wellness Visit.  Review of Systems    No ROS.  Medicare Wellness Visit. Additional risk factors are reflected in the social history.   Sleep patterns:    Female:    Mammo- last 08/01/15: pt reported normal  Dexa scan- last 06/09/16: osteopenia  CCS- last 06/01/06 : pt reports normal    Objective:    There were no vitals filed for this visit. There is no height or weight on file to calculate BMI.   Current Medications (verified) Outpatient Encounter Medications as of 01/13/2017  Medication Sig  . FLUoxetine (PROZAC) 20 MG tablet Take 1 tablet (20 mg total) by mouth daily.  Marland Kitchen. ibandronate (BONIVA) 150 MG tablet Take 1 tablet (150 mg total) by mouth every 30 (thirty) days. Take in the morning with a full glass of water, on an empty stomach, and do not take anything else by mouth or lie down for the next 30 min.  Marland Kitchen. ibuprofen (ADVIL,MOTRIN) 600 MG tablet Take 600 mg by mouth.  . pantoprazole (PROTONIX) 40 MG tablet Take 40 mg by mouth.   No facility-administered encounter medications on file as of 01/13/2017.     Allergies (verified) Morphine   History: Past Medical History:  Diagnosis Date  . Headache   . Shingles    Past Surgical History:  Procedure Laterality Date  . ABDOMINAL HYSTERECTOMY    . CHOLECYSTECTOMY    . TOE SURGERY Bilateral    in-grown toe nails   Family History  Problem Relation Age of Onset  . Pulmonary fibrosis Mother   . Idiopathic pulmonary fibrosis Mother   . Bladder Cancer Father   . Cancer Father        Bladder cancer  . Heart attack Brother   . Pulmonary fibrosis Brother   . Rheum arthritis Brother   . Idiopathic pulmonary fibrosis Brother   . Arthritis/Rheumatoid Brother   . Neuropathy Neg Hx   . Stroke Neg Hx   . Ataxia Neg Hx    Social History   Occupational History  . Occupation: Retired   Tobacco Use  . Smoking status: Never Smoker    Substance and Sexual Activity  . Alcohol use: No    Alcohol/week: 0.0 oz  . Drug use: No  . Sexual activity: Not on file    Tobacco Counseling Counseling given: Not Answered   Activities of Daily Living No flowsheet data found.  Immunizations and Health Maintenance Immunization History  Administered Date(s) Administered  . Influenza, High Dose Seasonal PF 12/16/2012, 12/24/2016  . Influenza-Unspecified 12/01/2015  . Pneumococcal Conjugate-13 08/19/2016  . Pneumococcal Polysaccharide-23 03/03/2011  . Tdap 11/05/2011   There are no preventive care reminders to display for this patient.  Patient Care Team: Copland, Gwenlyn FoundJessica C, MD as PCP - General (Family Medicine) Sydnee CabalShearin, Mary D., MD as Consulting Physician (Gastroenterology)  Indicate any recent Medical Services you may have received from other than [facility] providers in the past year (date may be approximate).     Assessment:   This is a routine wellness examination for Caroline Sandoval. Physical assessment deferred to PCP.  Hearing/Vision screen No exam data present  Dietary issues and exercise activities discussed:   Diet (meal preparation, eat out, water intake, caffeinated beverages, dairy products, fruits and vegetables): {Desc; diets:16563} Breakfast: Lunch:  Dinner:      Goals    None     Depression Screen No flowsheet data  found.  Fall Risk Fall Risk  05/29/2015  Falls in the past year? Yes  Number falls in past yr: 1  Injury with Fall? Yes  Follow up Falls prevention discussed    Cognitive Function:        Screening Tests Health Maintenance  Topic Date Due  . TETANUS/TDAP  11/04/2021  . COLONOSCOPY  07/01/2026  . INFLUENZA VACCINE  Completed  . DEXA SCAN  Completed  . PNA vac Low Risk Adult  Completed      Plan:   ***  I have personally reviewed and noted the following in the patient's chart:   . Medical and social history . Use of alcohol, tobacco or illicit drugs  . Current medications  and supplements . Functional ability and status . Nutritional status . Physical activity . Advanced directives . List of other physicians . Hospitalizations, surgeries, and ER visits in previous 12 months . Vitals . Screenings to include cognitive, depression, and falls . Referrals and appointments  In addition, I have reviewed and discussed with patient certain preventive protocols, quality metrics, and best practice recommendations. A written personalized care plan for preventive services as well as general preventive health recommendations were provided to patient.     Avon GullyBritt, Maziyah Vessel Angel, [place]   01/11/2017

## 2017-01-13 ENCOUNTER — Ambulatory Visit: Payer: PPO | Admitting: *Deleted

## 2017-01-18 NOTE — Progress Notes (Deleted)
Subjective:   Caroline Sandoval is a 76 y.o. female who presents for Medicare Annual (Subsequent) preventive examination.  Review of Systems:  No ROS.  Medicare Wellness Visit. Additional risk factors are reflected in the social history.    Sleep patterns:   Female:   Pap-  No longer doing routine screening due to age.    Mammo-reported last 08/01/15-normal       Dexa scan- last 06/09/16-osteopenia       CCS- pt reported 06/01/06-normal  Objective:     Vitals: There were no vitals taken for this visit.  There is no height or weight on file to calculate BMI.   Tobacco Social History   Tobacco Use  Smoking Status Never Smoker     Counseling given: Not Answered   Past Medical History:  Diagnosis Date  . Headache   . Shingles    Past Surgical History:  Procedure Laterality Date  . ABDOMINAL HYSTERECTOMY    . CHOLECYSTECTOMY    . TOE SURGERY Bilateral    in-grown toe nails   Family History  Problem Relation Age of Onset  . Pulmonary fibrosis Mother   . Idiopathic pulmonary fibrosis Mother   . Bladder Cancer Father   . Cancer Father        Bladder cancer  . Heart attack Brother   . Pulmonary fibrosis Brother   . Rheum arthritis Brother   . Idiopathic pulmonary fibrosis Brother   . Arthritis/Rheumatoid Brother   . Neuropathy Neg Hx   . Stroke Neg Hx   . Ataxia Neg Hx    Social History   Substance and Sexual Activity  Sexual Activity Not on file    Outpatient Encounter Medications as of 01/25/2017  Medication Sig  . FLUoxetine (PROZAC) 20 MG tablet Take 1 tablet (20 mg total) by mouth daily.  Marland Kitchen. ibandronate (BONIVA) 150 MG tablet Take 1 tablet (150 mg total) by mouth every 30 (thirty) days. Take in the morning with a full glass of water, on an empty stomach, and do not take anything else by mouth or lie down for the next 30 min.  Marland Kitchen. ibuprofen (ADVIL,MOTRIN) 600 MG tablet Take 600 mg by mouth.  . pantoprazole (PROTONIX) 40 MG tablet Take 40 mg by mouth.   No  facility-administered encounter medications on file as of 01/25/2017.     Activities of Daily Living No flowsheet data found.  Patient Care Team: Copland, Gwenlyn FoundJessica C, MD as PCP - General (Family Medicine) Sydnee CabalShearin, Mary D., MD as Consulting Physician (Gastroenterology)    Assessment:    Physical assessment deferred to PCP.  Exercise Activities and Dietary recommendations   Diet (meal preparation, eat out, water intake, caffeinated beverages, dairy products, fruits and vegetables): {Desc; diets:16563} Breakfast: Lunch:  Dinner:      Goals    None     Fall Risk Fall Risk  05/29/2015  Falls in the past year? Yes  Number falls in past yr: 1  Injury with Fall? Yes  Follow up Falls prevention discussed   Depression Screen No flowsheet data found.   Cognitive Function        Immunization History  Administered Date(s) Administered  . Influenza, High Dose Seasonal PF 12/16/2012, 12/24/2016  . Influenza-Unspecified 12/01/2015  . Pneumococcal Conjugate-13 08/19/2016  . Pneumococcal Polysaccharide-23 03/03/2011  . Tdap 11/05/2011   Screening Tests Health Maintenance  Topic Date Due  . TETANUS/TDAP  11/04/2021  . COLONOSCOPY  07/01/2026  . INFLUENZA VACCINE  Completed  . DEXA  SCAN  Completed  . PNA vac Low Risk Adult  Completed      Plan:   ***   I have personally reviewed and noted the following in the patient's chart:   . Medical and social history . Use of alcohol, tobacco or illicit drugs  . Current medications and supplements . Functional ability and status . Nutritional status . Physical activity . Advanced directives . List of other physicians . Hospitalizations, surgeries, and ER visits in previous 12 months . Vitals . Screenings to include cognitive, depression, and falls . Referrals and appointments  In addition, I have reviewed and discussed with patient certain preventive protocols, quality metrics, and best practice recommendations. A written  personalized care plan for preventive services as well as general preventive health recommendations were provided to patient.     Avon GullyBritt, Khadeem Rockett Angel, [place]  01/18/2017

## 2017-01-20 NOTE — Progress Notes (Signed)
Subjective:   Caroline Sandoval is a 76 y.o. female who presents for an Initial Medicare Annual Wellness Visit.  Review of Systems    No ROS.  Medicare Wellness Visit. Additional risk factors are reflected in the social history.   Cardiac Risk Factors include: advanced age (>18men, >36 women) Sleep patterns: Sleeps 6-7 hrs per night most nights.  Home Safety/Smoke Alarms: Feels safe in home. Smoke alarms in place.  Living environment:Lives with husband. Daughter lives close.    Female:       Mammo- last 09/30/16 per pt-normal Dexa scan- last 06/09/16: osteopenia        CCS- Last per pt 07/31/16 with Dr.Sherron. Record request sent.  Dentist- Pt states she will schedule appt.     Objective:    Today's Vitals   01/27/17 0849  BP: 110/60  Pulse: 60  SpO2: 98%  Weight: 126 lb 12.8 oz (57.5 kg)  Height: 5\' 1"  (1.549 m)   Body mass index is 23.96 kg/m.   Current Medications (verified) Outpatient Encounter Medications as of 01/27/2017  Medication Sig  . FLUoxetine (PROZAC) 20 MG tablet Take 1 tablet (20 mg total) by mouth daily.  Marland Kitchen ibandronate (BONIVA) 150 MG tablet Take 1 tablet (150 mg total) by mouth every 30 (thirty) days. Take in the morning with a full glass of water, on an empty stomach, and do not take anything else by mouth or lie down for the next 30 min.  . pantoprazole (PROTONIX) 40 MG tablet Take 40 mg by mouth.  Marland Kitchen ibuprofen (ADVIL,MOTRIN) 600 MG tablet Take 600 mg by mouth.   No facility-administered encounter medications on file as of 01/27/2017.     Allergies (verified) Morphine   History: Past Medical History:  Diagnosis Date  . Headache   . Shingles    Past Surgical History:  Procedure Laterality Date  . ABDOMINAL HYSTERECTOMY    . CHOLECYSTECTOMY    . TOE SURGERY Bilateral    in-grown toe nails   Family History  Problem Relation Age of Onset  . Pulmonary fibrosis Mother   . Idiopathic pulmonary fibrosis Mother   . Bladder Cancer Father   .  Cancer Father        Bladder cancer  . Heart attack Brother   . Pulmonary fibrosis Brother   . Rheum arthritis Brother   . Idiopathic pulmonary fibrosis Brother   . Arthritis/Rheumatoid Brother   . Pulmonary fibrosis Brother   . Hypertension Brother   . Arthritis Brother   . Neuropathy Neg Hx   . Stroke Neg Hx   . Ataxia Neg Hx    Social History   Occupational History  . Occupation: Retired   Tobacco Use  . Smoking status: Never Smoker  Substance and Sexual Activity  . Alcohol use: No    Alcohol/week: 0.0 oz  . Drug use: No  . Sexual activity: No    Tobacco Counseling Counseling given: Not Answered   Activities of Daily Living In your present state of health, do you have any difficulty performing the following activities: 01/27/2017  Hearing? N  Vision? N  Comment Wearing glasses. Pt states she is past due for eye exam and will schedule appt soon.  Difficulty concentrating or making decisions? N  Comment Pt states she likes to read alot.  Walking or climbing stairs? N  Dressing or bathing? N  Doing errands, shopping? N  Preparing Food and eating ? N  Using the Toilet? N  In the past six  months, have you accidently leaked urine? N  Do you have problems with loss of bowel control? N  Managing your Medications? N  Managing your Finances? N  Housekeeping or managing your Housekeeping? N  Some recent data might be hidden    Immunizations and Health Maintenance Immunization History  Administered Date(s) Administered  . Influenza, High Dose Seasonal PF 12/16/2012, 12/24/2016  . Influenza-Unspecified 12/01/2015  . Pneumococcal Conjugate-13 08/19/2016  . Pneumococcal Polysaccharide-23 03/03/2011  . Tdap 11/05/2011   There are no preventive care reminders to display for this patient.  Patient Care Team: Copland, Caroline FoundJessica C, MD as PCP - General (Family Medicine) Caroline Sandoval, Mary D., MD as Consulting Physician (Gastroenterology)  Indicate any recent Medical Services  you may have received from other than [facility] providers in the past year (date may be approximate).     Assessment:   This is a routine wellness examination for [name]. Physical assessment deferred to PCP.  Hearing/Vision screen Hearing Screening Comments: Able to hear conversational tones w/o difficulty. No issues reported.  Passes whisper test.  Vision Screening Comments: Wearing glasses. States she will make yearly eye exam soon because she thinks she needs a new prescription strength.  Dietary issues and exercise activities discussed: Current Exercise Habits: Home exercise routine, Time (Minutes): 30, Frequency (Times/Week): 7, Weekly Exercise (Minutes/Week): 210, Intensity: Mild Diet (meal preparation, eat out, water intake, caffeinated beverages, dairy products, fruits and vegetables): in general, a "healthy" diet        Goals    . Maintain healthy eating and normal BMI (pt-stated)      Depression Screen PHQ 2/9 Scores 01/27/2017  PHQ - 2 Score 0    Fall Risk Fall Risk  01/27/2017 05/29/2015  Falls in the past year? Yes Yes  Number falls in past yr: 1 1  Injury with Fall? Yes Yes  Follow up Education provided;Falls prevention discussed Falls prevention discussed    Cognitive Function: MMSE - Mini Mental State Exam 01/27/2017  Orientation to time 5  Orientation to Place 5  Registration 3  Attention/ Calculation 5  Recall 3  Language- name 2 objects 2  Language- repeat 1  Language- follow 3 step command 3  Language- read & follow direction 1  Write a sentence 1  Copy design 1  Total score 30        Screening Tests Health Maintenance  Topic Date Due  . TETANUS/TDAP  11/04/2021  . COLONOSCOPY  07/01/2026  . INFLUENZA VACCINE  Completed  . DEXA SCAN  Completed  . PNA vac Low Risk Adult  Completed      Plan:   Follow up with Dr.Copland as directed  Continue to eat heart healthy diet (full of fruits, vegetables, whole grains, lean protein, water--limit  salt, fat, and sugar intake) and increase physical activity as tolerated.  Continue doing brain stimulating activities (puzzles, reading, adult coloring books, staying active) to keep memory sharp.   Bring a copy of your living will and/or healthcare power of attorney to your next office visit.  Please schedule eye exam and dental checkup.  Release of info form sent to Dr.Shearin's office for colonoscopy report.  I have personally reviewed and noted the following in the patient's chart:   . Medical and social history . Use of alcohol, tobacco or illicit drugs  . Current medications and supplements . Functional ability and status . Nutritional status . Physical activity . Advanced directives . List of other physicians . Hospitalizations, surgeries, and ER visits in previous  12 months . Vitals . Screenings to include cognitive, depression, and falls . Referrals and appointments  In addition, I have reviewed and discussed with patient certain preventive protocols, quality metrics, and best practice recommendations. A written personalized care plan for preventive services as well as general preventive health recommendations were provided to patient.     Avon GullyBritt, Alexandria Current Angel, [place]   01/27/2017

## 2017-01-25 ENCOUNTER — Ambulatory Visit: Payer: PPO | Admitting: *Deleted

## 2017-01-27 ENCOUNTER — Encounter: Payer: Self-pay | Admitting: *Deleted

## 2017-01-27 ENCOUNTER — Ambulatory Visit (INDEPENDENT_AMBULATORY_CARE_PROVIDER_SITE_OTHER): Payer: PPO | Admitting: *Deleted

## 2017-01-27 VITALS — BP 110/60 | HR 60 | Ht 61.0 in | Wt 126.8 lb

## 2017-01-27 DIAGNOSIS — Z0000 Encounter for general adult medical examination without abnormal findings: Secondary | ICD-10-CM | POA: Diagnosis not present

## 2017-01-27 NOTE — Patient Instructions (Addendum)
Follow up with Dr.Copland as directed  Continue to eat heart healthy diet (full of fruits, vegetables, whole grains, lean protein, water--limit salt, fat, and sugar intake) and increase physical activity as tolerated.  Continue doing brain stimulating activities (puzzles, reading, adult coloring books, staying active) to keep memory sharp.   Bring a copy of your living will and/or healthcare power of attorney to your next office visit.  Please schedule eye exam and dental checkup.  Release of info form sent to Dr.Shearin's office for colonoscopy report.   Caroline Sandoval , Thank you for taking time to come for your Medicare Wellness Visit. I appreciate your ongoing commitment to your health goals. Please review the following plan we discussed and let me know if I can assist you in the future.   These are the goals we discussed: Goals    . Maintain healthy eating and normal BMI (pt-stated)       This is a list of the screening recommended for you and due dates:  Health Maintenance  Topic Date Due  . Tetanus Vaccine  11/04/2021  . Colon Cancer Screening  07/01/2026  . Flu Shot  Completed  . DEXA scan (bone density measurement)  Completed  . Pneumonia vaccines  Completed    Health Maintenance for Postmenopausal Women Menopause is a normal process in which your reproductive ability comes to an end. This process happens gradually over a span of months to years, usually between the ages of 51 and 33. Menopause is complete when you have missed 12 consecutive menstrual periods. It is important to talk with your health care provider about some of the most common conditions that affect postmenopausal women, such as heart disease, cancer, and bone loss (osteoporosis). Adopting a healthy lifestyle and getting preventive care can help to promote your health and wellness. Those actions can also lower your chances of developing some of these common conditions. What should I know about  menopause? During menopause, you may experience a number of symptoms, such as:  Moderate-to-severe hot flashes.  Night sweats.  Decrease in sex drive.  Mood swings.  Headaches.  Tiredness.  Irritability.  Memory problems.  Insomnia.  Choosing to treat or not to treat menopausal changes is an individual decision that you make with your health care provider. What should I know about hormone replacement therapy and supplements? Hormone therapy products are effective for treating symptoms that are associated with menopause, such as hot flashes and night sweats. Hormone replacement carries certain risks, especially as you become older. If you are thinking about using estrogen or estrogen with progestin treatments, discuss the benefits and risks with your health care provider. What should I know about heart disease and stroke? Heart disease, heart attack, and stroke become more likely as you age. This may be due, in part, to the hormonal changes that your body experiences during menopause. These can affect how your body processes dietary fats, triglycerides, and cholesterol. Heart attack and stroke are both medical emergencies. There are many things that you can do to help prevent heart disease and stroke:  Have your blood pressure checked at least every 1-2 years. High blood pressure causes heart disease and increases the risk of stroke.  If you are 46-64 years old, ask your health care provider if you should take aspirin to prevent a heart attack or a stroke.  Do not use any tobacco products, including cigarettes, chewing tobacco, or electronic cigarettes. If you need help quitting, ask your health care provider.  It  is important to eat a healthy diet and maintain a healthy weight. ? Be sure to include plenty of vegetables, fruits, low-fat dairy products, and lean protein. ? Avoid eating foods that are high in solid fats, added sugars, or salt (sodium).  Get regular exercise. This  is one of the most important things that you can do for your health. ? Try to exercise for at least 150 minutes each week. The type of exercise that you do should increase your heart rate and make you sweat. This is known as moderate-intensity exercise. ? Try to do strengthening exercises at least twice each week. Do these in addition to the moderate-intensity exercise.  Know your numbers.Ask your health care provider to check your cholesterol and your blood glucose. Continue to have your blood tested as directed by your health care provider.  What should I know about cancer screening? There are several types of cancer. Take the following steps to reduce your risk and to catch any cancer development as early as possible. Breast Cancer  Practice breast self-awareness. ? This means understanding how your breasts normally appear and feel. ? It also means doing regular breast self-exams. Let your health care provider know about any changes, no matter how small.  If you are 30 or older, have a clinician do a breast exam (clinical breast exam or CBE) every year. Depending on your age, family history, and medical history, it may be recommended that you also have a yearly breast X-ray (mammogram).  If you have a family history of breast cancer, talk with your health care provider about genetic screening.  If you are at high risk for breast cancer, talk with your health care provider about having an MRI and a mammogram every year.  Breast cancer (BRCA) gene test is recommended for women who have family members with BRCA-related cancers. Results of the assessment will determine the need for genetic counseling and BRCA1 and for BRCA2 testing. BRCA-related cancers include these types: ? Breast. This occurs in males or females. ? Ovarian. ? Tubal. This may also be called fallopian tube cancer. ? Cancer of the abdominal or pelvic lining (peritoneal cancer). ? Prostate. ? Pancreatic.  Cervical,  Uterine, and Ovarian Cancer Your health care provider may recommend that you be screened regularly for cancer of the pelvic organs. These include your ovaries, uterus, and vagina. This screening involves a pelvic exam, which includes checking for microscopic changes to the surface of your cervix (Pap test).  For women ages 21-65, health care providers may recommend a pelvic exam and a Pap test every three years. For women ages 48-65, they may recommend the Pap test and pelvic exam, combined with testing for human papilloma virus (HPV), every five years. Some types of HPV increase your risk of cervical cancer. Testing for HPV may also be done on women of any age who have unclear Pap test results.  Other health care providers may not recommend any screening for nonpregnant women who are considered low risk for pelvic cancer and have no symptoms. Ask your health care provider if a screening pelvic exam is right for you.  If you have had past treatment for cervical cancer or a condition that could lead to cancer, you need Pap tests and screening for cancer for at least 20 years after your treatment. If Pap tests have been discontinued for you, your risk factors (such as having a new sexual partner) need to be reassessed to determine if you should start having screenings again.  Some women have medical problems that increase the chance of getting cervical cancer. In these cases, your health care provider may recommend that you have screening and Pap tests more often.  If you have a family history of uterine cancer or ovarian cancer, talk with your health care provider about genetic screening.  If you have vaginal bleeding after reaching menopause, tell your health care provider.  There are currently no reliable tests available to screen for ovarian cancer.  Lung Cancer Lung cancer screening is recommended for adults 60-78 years old who are at high risk for lung cancer because of a history of smoking. A  yearly low-dose CT scan of the lungs is recommended if you:  Currently smoke.  Have a history of at least 30 pack-years of smoking and you currently smoke or have quit within the past 15 years. A pack-year is smoking an average of one pack of cigarettes per day for one year.  Yearly screening should:  Continue until it has been 15 years since you quit.  Stop if you develop a health problem that would prevent you from having lung cancer treatment.  Colorectal Cancer  This type of cancer can be detected and can often be prevented.  Routine colorectal cancer screening usually begins at age 15 and continues through age 37.  If you have risk factors for colon cancer, your health care provider may recommend that you be screened at an earlier age.  If you have a family history of colorectal cancer, talk with your health care provider about genetic screening.  Your health care provider may also recommend using home test kits to check for hidden blood in your stool.  A small camera at the end of a tube can be used to examine your colon directly (sigmoidoscopy or colonoscopy). This is done to check for the earliest forms of colorectal cancer.  Direct examination of the colon should be repeated every 5-10 years until age 84. However, if early forms of precancerous polyps or small growths are found or if you have a family history or genetic risk for colorectal cancer, you may need to be screened more often.  Skin Cancer  Check your skin from head to toe regularly.  Monitor any moles. Be sure to tell your health care provider: ? About any new moles or changes in moles, especially if there is a change in a mole's shape or color. ? If you have a mole that is larger than the size of a pencil eraser.  If any of your family members has a history of skin cancer, especially at a young age, talk with your health care provider about genetic screening.  Always use sunscreen. Apply sunscreen liberally  and repeatedly throughout the day.  Whenever you are outside, protect yourself by wearing long sleeves, pants, a wide-brimmed hat, and sunglasses.  What should I know about osteoporosis? Osteoporosis is a condition in which bone destruction happens more quickly than new bone creation. After menopause, you may be at an increased risk for osteoporosis. To help prevent osteoporosis or the bone fractures that can happen because of osteoporosis, the following is recommended:  If you are 50-50 years old, get at least 1,000 mg of calcium and at least 600 mg of vitamin D per day.  If you are older than age 30 but younger than age 6, get at least 1,200 mg of calcium and at least 600 mg of vitamin D per day.  If you are older than age 57, get  at least 1,200 mg of calcium and at least 800 mg of vitamin D per day.  Smoking and excessive alcohol intake increase the risk of osteoporosis. Eat foods that are rich in calcium and vitamin D, and do weight-bearing exercises several times each week as directed by your health care provider. What should I know about how menopause affects my mental health? Depression may occur at any age, but it is more common as you become older. Common symptoms of depression include:  Low or sad mood.  Changes in sleep patterns.  Changes in appetite or eating patterns.  Feeling an overall lack of motivation or enjoyment of activities that you previously enjoyed.  Frequent crying spells.  Talk with your health care provider if you think that you are experiencing depression. What should I know about immunizations? It is important that you get and maintain your immunizations. These include:  Tetanus, diphtheria, and pertussis (Tdap) booster vaccine.  Influenza every year before the flu season begins.  Pneumonia vaccine.  Shingles vaccine.  Your health care provider may also recommend other immunizations. This information is not intended to replace advice given to you  by your health care provider. Make sure you discuss any questions you have with your health care provider. Document Released: 04/10/2005 Document Revised: 09/06/2015 Document Reviewed: 11/20/2014 Elsevier Interactive Patient Education  2018 [employer].

## 2017-02-11 DIAGNOSIS — K219 Gastro-esophageal reflux disease without esophagitis: Secondary | ICD-10-CM | POA: Diagnosis not present

## 2017-02-14 ENCOUNTER — Telehealth: Payer: Self-pay | Admitting: Family Medicine

## 2017-02-14 NOTE — Telephone Encounter (Signed)
Received GI records from [facility]- will abstract and scan as needed

## 2017-04-16 ENCOUNTER — Encounter: Payer: Self-pay | Admitting: Medical

## 2017-04-16 ENCOUNTER — Ambulatory Visit (INDEPENDENT_AMBULATORY_CARE_PROVIDER_SITE_OTHER): Payer: PPO | Admitting: Medical

## 2017-04-16 VITALS — BP 115/59 | HR 85 | Temp 98.2°F | Resp 16 | Ht 61.0 in | Wt 128.0 lb

## 2017-04-16 DIAGNOSIS — R05 Cough: Secondary | ICD-10-CM | POA: Diagnosis not present

## 2017-04-16 DIAGNOSIS — J40 Bronchitis, not specified as acute or chronic: Secondary | ICD-10-CM

## 2017-04-16 DIAGNOSIS — J0100 Acute maxillary sinusitis, unspecified: Secondary | ICD-10-CM | POA: Diagnosis not present

## 2017-04-16 DIAGNOSIS — R059 Cough, unspecified: Secondary | ICD-10-CM

## 2017-04-16 LAB — POCT INFLUENZA A/B
INFLUENZA A, POC: NEGATIVE
Influenza B, POC: NEGATIVE

## 2017-04-16 MED ORDER — FLUTICASONE PROPIONATE 50 MCG/ACT NA SUSP: 2.0000 | Freq: Every day | NASAL | 1 refills | Status: DC

## 2017-04-16 MED ORDER — BENZONATATE 100 MG PO CAPS: 100.0000 mg | ORAL_CAPSULE | Freq: Three times a day (TID) | ORAL | 0 refills | Status: DC | PRN

## 2017-04-16 MED ORDER — AZITHROMYCIN 250 MG PO TABS
ORAL_TABLET | ORAL | 0 refills | Status: DC
Start: 2017-04-16 — End: 2017-11-22

## 2017-04-16 NOTE — Patient Instructions (Signed)
   You appear to have bronchitis and sinusitis. Rest hydrate and tylenol for fever. I am prescribing cough medicine benzonatate, and azithromycin antibiotic. For your nasal congestion you could use otc the counter nasal steroid flonase.  You should gradually get better. If not then notify us and would recommend a chest xray.  Follow up in 7-10 days or as needed 

## 2017-04-16 NOTE — Progress Notes (Signed)
Subjective:    Patient ID: Caroline Sandoval, female    DOB: 12/20/1940, 77 y.o.   MRN: [id number]  HPI  Pt in states 2 days of runny nose, cough, nasal congestion and som ear pain. Some fatigue.   No body aches, no fever, and no chills.  Pt states yesterday cough started to get productive. Also some colored mucus from nose when she blows her nose.     Review of Systems  Constitutional: Negative for chills, fatigue and fever.  HENT: Positive for congestion, sinus pressure and sinus pain. Negative for facial swelling and sore throat.        Left ear pressure/pain  Respiratory: Positive for cough. Negative for chest tightness, shortness of breath and wheezing.   Cardiovascular: Negative for chest pain and palpitations.  Gastrointestinal: Negative for abdominal pain.  Genitourinary: Negative for difficulty urinating, flank pain, frequency and urgency.  Musculoskeletal: Negative for back pain and myalgias.  Skin: Negative for rash.  Neurological: Negative for dizziness, syncope, weakness, numbness and headaches.  Hematological: Negative for adenopathy. Does not bruise/bleed easily.  Psychiatric/Behavioral: Negative for behavioral problems and confusion. The patient is not nervous/anxious.     Past Medical History:  Diagnosis Date  . Headache   . Shingles      Social History   Socioeconomic History  . Marital status: Married    Spouse name: Not on file  . Number of children: 2  . Years of education: College  . Highest education level: Not on file  Social Needs  . Financial resource strain: Not on file  . Food insecurity - worry: Not on file  . Food insecurity - inability: Not on file  . Transportation needs - medical: Not on file  . Transportation needs - non-medical: Not on file  Occupational History  . Occupation: Retired   Tobacco Use  . Smoking status: Never Smoker  . Smokeless tobacco: Never Used  Substance and Sexual Activity  . Alcohol use: No   Alcohol/week: 0.0 oz  . Drug use: No  . Sexual activity: No  Other Topics Concern  . Not on file  Social History Narrative   Drinks about 1 cup of coffee a day, may drink a soda during the day     Past Surgical History:  Procedure Laterality Date  . ABDOMINAL HYSTERECTOMY    . CHOLECYSTECTOMY    . TOE SURGERY Bilateral    in-grown toe nails    Family History  Problem Relation Age of Onset  . Pulmonary fibrosis Mother   . Idiopathic pulmonary fibrosis Mother   . Bladder Cancer Father   . Cancer Father        Bladder cancer  . Heart attack Brother   . Pulmonary fibrosis Brother   . Rheum arthritis Brother   . Idiopathic pulmonary fibrosis Brother   . Arthritis/Rheumatoid Brother   . Pulmonary fibrosis Brother   . Hypertension Brother   . Arthritis Brother   . Neuropathy Neg Hx   . Stroke Neg Hx   . Ataxia Neg Hx     Allergies  Allergen Reactions  . Morphine     Drops BP and HR. Per pt    Current Outpatient Medications on File Prior to Visit  Medication Sig Dispense Refill  . FLUoxetine (PROZAC) 20 MG tablet Take 1 tablet (20 mg total) by mouth daily. 90 tablet 3  . ibandronate (BONIVA) 150 MG tablet Take 1 tablet (150 mg total) by mouth every 30 (thirty) days. Take in  the morning with a full glass of water, on an empty stomach, and do not take anything else by mouth or lie down for the next 30 min. 3 tablet 4  . ibuprofen (ADVIL,MOTRIN) 600 MG tablet Take 600 mg by mouth.    . pantoprazole (PROTONIX) 40 MG tablet Take 40 mg by mouth.     No current facility-administered medications on file prior to visit.     BP (!) 115/59 (BP Location: Left Arm, Patient Position: Sitting, Cuff Size: Small)   Pulse 85   Temp 98.2 F (36.8 C) (Oral)   Resp 16   Ht 5\' 1"  (1.549 m)   Wt 128 lb (58.1 kg)   SpO2 98%   BMI 24.19 kg/m       Objective:   Physical Exam  General  Mental Status - Alert. General Appearance - Well groomed. Not in acute  distress.  Skin Rashes- No Rashes.  HEENT Head- Normal. Ear Auditory Canal - Left- Normal. Right - Normal.Tympanic Membrane- Left- Normal. Right- Normal. Eye Sclera/Conjunctiva- Left- Normal. Right- Normal. Nose & Sinuses Nasal Mucosa- Left-  Boggy and Congested. Right-  Boggy and  Congested.Bilateral maxillary and frontal sinus pressure. Mouth & Throat Lips: Upper Lip- Normal: no dryness, cracking, pallor, cyanosis, or vesicular eruption. Lower Lip-Normal: no dryness, cracking, pallor, cyanosis or vesicular eruption. Buccal Mucosa- Bilateral- No Aphthous ulcers. Oropharynx- No Discharge or Erythema. Tonsils: Characteristics- Bilateral- No Erythema or Congestion. Size/Enlargement- Bilateral- No enlargement. Discharge- bilateral-None.  Neck Neck- Supple. No Masses.   Chest and Lung Exam Auscultation: Breath Sounds:-Clear even and unlabored.  Cardiovascular Auscultation:Rythm- Regular, rate and rhythm. Murmurs & Other Heart Sounds:Ausculatation of the heart reveal- No Murmurs.  Lymphatic Head & Neck General Head & Neck Lymphatics: Bilateral: Description- No Localized lymphadenopathy.       Assessment & Plan:   You appear to have bronchitis and sinusitis. Rest hydrate and tylenol for fever. I am prescribing cough medicine benzonatate, and azithromycin  antibiotic. For your nasal congestion you could use otc the counter nasal steroid flonase   You should gradually get better. If not then notify us and would recommend a chest xray.  Follow up in 7-10 days or as needed
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
## 2017-05-24 ENCOUNTER — Ambulatory Visit: Payer: PPO | Admitting: Family Medicine

## 2017-05-24 DIAGNOSIS — Z0289 Encounter for other administrative examinations: Secondary | ICD-10-CM

## 2017-08-10 DIAGNOSIS — H2512 Age-related nuclear cataract, left eye: Secondary | ICD-10-CM | POA: Diagnosis not present

## 2017-08-10 DIAGNOSIS — H527 Unspecified disorder of refraction: Secondary | ICD-10-CM | POA: Diagnosis not present

## 2017-08-10 DIAGNOSIS — H353121 Nonexudative age-related macular degeneration, left eye, early dry stage: Secondary | ICD-10-CM | POA: Diagnosis not present

## 2017-08-10 DIAGNOSIS — H25811 Combined forms of age-related cataract, right eye: Secondary | ICD-10-CM | POA: Diagnosis not present

## 2017-08-18 ENCOUNTER — Other Ambulatory Visit: Payer: Self-pay | Admitting: Family Medicine

## 2017-08-18 DIAGNOSIS — F329 Major depressive disorder, single episode, unspecified: Secondary | ICD-10-CM

## 2017-08-19 NOTE — Telephone Encounter (Signed)
Pt. Requesting prozac refill; Last OV with PCP 11/2016. Please advise.

## 2017-10-17 ENCOUNTER — Other Ambulatory Visit: Payer: Self-pay | Admitting: Family Medicine

## 2017-10-17 DIAGNOSIS — E2839 Other primary ovarian failure: Secondary | ICD-10-CM

## 2017-10-17 DIAGNOSIS — S62102A Fracture of unspecified carpal bone, left wrist, initial encounter for closed fracture: Secondary | ICD-10-CM

## 2017-10-17 DIAGNOSIS — M8580 Other specified disorders of bone density and structure, unspecified site: Secondary | ICD-10-CM

## 2017-10-20 DIAGNOSIS — S299XXA Unspecified injury of thorax, initial encounter: Secondary | ICD-10-CM | POA: Diagnosis not present

## 2017-10-20 DIAGNOSIS — M810 Age-related osteoporosis without current pathological fracture: Secondary | ICD-10-CM | POA: Diagnosis not present

## 2017-10-20 DIAGNOSIS — R52 Pain, unspecified: Secondary | ICD-10-CM | POA: Diagnosis not present

## 2017-10-20 DIAGNOSIS — S52611D Displaced fracture of right ulna styloid process, subsequent encounter for closed fracture with routine healing: Secondary | ICD-10-CM | POA: Diagnosis not present

## 2017-10-20 DIAGNOSIS — T07XXXA Unspecified multiple injuries, initial encounter: Secondary | ICD-10-CM | POA: Diagnosis not present

## 2017-10-20 DIAGNOSIS — I10 Essential (primary) hypertension: Secondary | ICD-10-CM | POA: Diagnosis not present

## 2017-10-20 DIAGNOSIS — R402142 Coma scale, eyes open, spontaneous, at arrival to emergency department: Secondary | ICD-10-CM | POA: Diagnosis not present

## 2017-10-20 DIAGNOSIS — M6281 Muscle weakness (generalized): Secondary | ICD-10-CM | POA: Diagnosis not present

## 2017-10-20 DIAGNOSIS — S52611A Displaced fracture of right ulna styloid process, initial encounter for closed fracture: Secondary | ICD-10-CM | POA: Diagnosis not present

## 2017-10-20 DIAGNOSIS — K219 Gastro-esophageal reflux disease without esophagitis: Secondary | ICD-10-CM | POA: Diagnosis not present

## 2017-10-20 DIAGNOSIS — S52511A Displaced fracture of right radial styloid process, initial encounter for closed fracture: Secondary | ICD-10-CM | POA: Diagnosis not present

## 2017-10-20 DIAGNOSIS — R0902 Hypoxemia: Secondary | ICD-10-CM | POA: Diagnosis not present

## 2017-10-20 DIAGNOSIS — W19XXXA Unspecified fall, initial encounter: Secondary | ICD-10-CM | POA: Diagnosis not present

## 2017-10-20 DIAGNOSIS — S52209A Unspecified fracture of shaft of unspecified ulna, initial encounter for closed fracture: Secondary | ICD-10-CM | POA: Diagnosis not present

## 2017-10-20 DIAGNOSIS — S0990XA Unspecified injury of head, initial encounter: Secondary | ICD-10-CM | POA: Diagnosis not present

## 2017-10-20 DIAGNOSIS — Z4789 Encounter for other orthopedic aftercare: Secondary | ICD-10-CM | POA: Diagnosis not present

## 2017-10-20 DIAGNOSIS — S32591A Other specified fracture of right pubis, initial encounter for closed fracture: Secondary | ICD-10-CM | POA: Diagnosis not present

## 2017-10-20 DIAGNOSIS — G9009 Other idiopathic peripheral autonomic neuropathy: Secondary | ICD-10-CM | POA: Diagnosis not present

## 2017-10-20 DIAGNOSIS — S52571A Other intraarticular fracture of lower end of right radius, initial encounter for closed fracture: Secondary | ICD-10-CM | POA: Diagnosis not present

## 2017-10-20 DIAGNOSIS — Z885 Allergy status to narcotic agent status: Secondary | ICD-10-CM | POA: Diagnosis not present

## 2017-10-20 DIAGNOSIS — R402252 Coma scale, best verbal response, oriented, at arrival to emergency department: Secondary | ICD-10-CM | POA: Diagnosis not present

## 2017-10-20 DIAGNOSIS — S0541XA Penetrating wound of orbit with or without foreign body, right eye, initial encounter: Secondary | ICD-10-CM | POA: Diagnosis not present

## 2017-10-20 DIAGNOSIS — S52501D Unspecified fracture of the lower end of right radius, subsequent encounter for closed fracture with routine healing: Secondary | ICD-10-CM | POA: Diagnosis not present

## 2017-10-20 DIAGNOSIS — S32592D Other specified fracture of left pubis, subsequent encounter for fracture with routine healing: Secondary | ICD-10-CM | POA: Diagnosis not present

## 2017-10-20 DIAGNOSIS — S32512D Fracture of superior rim of left pubis, subsequent encounter for fracture with routine healing: Secondary | ICD-10-CM | POA: Diagnosis not present

## 2017-10-20 DIAGNOSIS — R2689 Other abnormalities of gait and mobility: Secondary | ICD-10-CM | POA: Diagnosis not present

## 2017-10-20 DIAGNOSIS — S060X9A Concussion with loss of consciousness of unspecified duration, initial encounter: Secondary | ICD-10-CM | POA: Diagnosis not present

## 2017-10-20 DIAGNOSIS — S0181XA Laceration without foreign body of other part of head, initial encounter: Secondary | ICD-10-CM | POA: Diagnosis not present

## 2017-10-20 DIAGNOSIS — G8911 Acute pain due to trauma: Secondary | ICD-10-CM | POA: Diagnosis not present

## 2017-10-20 DIAGNOSIS — S52201A Unspecified fracture of shaft of right ulna, initial encounter for closed fracture: Secondary | ICD-10-CM | POA: Diagnosis not present

## 2017-10-20 DIAGNOSIS — S329XXA Fracture of unspecified parts of lumbosacral spine and pelvis, initial encounter for closed fracture: Secondary | ICD-10-CM | POA: Diagnosis not present

## 2017-10-20 DIAGNOSIS — S3993XA Unspecified injury of pelvis, initial encounter: Secondary | ICD-10-CM | POA: Diagnosis not present

## 2017-10-20 DIAGNOSIS — S199XXA Unspecified injury of neck, initial encounter: Secondary | ICD-10-CM | POA: Diagnosis not present

## 2017-10-20 DIAGNOSIS — S32511A Fracture of superior rim of right pubis, initial encounter for closed fracture: Secondary | ICD-10-CM | POA: Diagnosis not present

## 2017-10-20 DIAGNOSIS — E785 Hyperlipidemia, unspecified: Secondary | ICD-10-CM | POA: Diagnosis not present

## 2017-10-20 DIAGNOSIS — W11XXXA Fall on and from ladder, initial encounter: Secondary | ICD-10-CM | POA: Diagnosis not present

## 2017-10-20 DIAGNOSIS — S52591A Other fractures of lower end of right radius, initial encounter for closed fracture: Secondary | ICD-10-CM | POA: Diagnosis not present

## 2017-10-20 DIAGNOSIS — S5290XA Unspecified fracture of unspecified forearm, initial encounter for closed fracture: Secondary | ICD-10-CM | POA: Diagnosis not present

## 2017-10-20 DIAGNOSIS — S098XXA Other specified injuries of head, initial encounter: Secondary | ICD-10-CM | POA: Diagnosis not present

## 2017-10-20 DIAGNOSIS — R402362 Coma scale, best motor response, obeys commands, at arrival to emergency department: Secondary | ICD-10-CM | POA: Diagnosis not present

## 2017-10-20 DIAGNOSIS — S52501A Unspecified fracture of the lower end of right radius, initial encounter for closed fracture: Secondary | ICD-10-CM | POA: Diagnosis not present

## 2017-10-21 ENCOUNTER — Telehealth: Payer: Self-pay | Admitting: *Deleted

## 2017-10-21 HISTORY — PX: ORIF RADIUS & ULNA FRACTURES: SHX2129

## 2017-10-21 HISTORY — PX: WRIST FRACTURE SURGERY: SHX121
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
## 2017-10-22 ENCOUNTER — Encounter: Payer: Self-pay | Admitting: Family Medicine

## 2017-10-22 NOTE — Telephone Encounter (Signed)
Called and spoke with her daughter.

## 2017-10-25 ENCOUNTER — Ambulatory Visit: Payer: PPO | Admitting: Family Medicine

## 2017-10-28 ENCOUNTER — Telehealth: Payer: Self-pay | Admitting: Family Medicine

## 2017-10-28 DIAGNOSIS — S32511S Fracture of superior rim of right pubis, sequela: Secondary | ICD-10-CM | POA: Diagnosis not present

## 2017-10-28 DIAGNOSIS — G8911 Acute pain due to trauma: Secondary | ICD-10-CM | POA: Diagnosis not present

## 2017-10-28 DIAGNOSIS — S52611S Displaced fracture of right ulna styloid process, sequela: Secondary | ICD-10-CM | POA: Diagnosis not present

## 2017-10-28 DIAGNOSIS — S52571D Other intraarticular fracture of lower end of right radius, subsequent encounter for closed fracture with routine healing: Secondary | ICD-10-CM | POA: Diagnosis not present

## 2017-10-28 DIAGNOSIS — S329XXA Fracture of unspecified parts of lumbosacral spine and pelvis, initial encounter for closed fracture: Secondary | ICD-10-CM | POA: Diagnosis not present

## 2017-10-28 DIAGNOSIS — S32592D Other specified fracture of left pubis, subsequent encounter for fracture with routine healing: Secondary | ICD-10-CM | POA: Diagnosis not present

## 2017-10-28 DIAGNOSIS — W11XXXA Fall on and from ladder, initial encounter: Secondary | ICD-10-CM | POA: Diagnosis not present

## 2017-10-28 DIAGNOSIS — S52501D Unspecified fracture of the lower end of right radius, subsequent encounter for closed fracture with routine healing: Secondary | ICD-10-CM | POA: Diagnosis not present

## 2017-10-28 DIAGNOSIS — S32591S Other specified fracture of right pubis, sequela: Secondary | ICD-10-CM | POA: Diagnosis not present

## 2017-10-28 DIAGNOSIS — S5290XA Unspecified fracture of unspecified forearm, initial encounter for closed fracture: Secondary | ICD-10-CM | POA: Diagnosis not present

## 2017-10-28 DIAGNOSIS — S32512D Fracture of superior rim of left pubis, subsequent encounter for fracture with routine healing: Secondary | ICD-10-CM | POA: Diagnosis not present

## 2017-10-28 DIAGNOSIS — G9009 Other idiopathic peripheral autonomic neuropathy: Secondary | ICD-10-CM | POA: Diagnosis not present

## 2017-10-28 DIAGNOSIS — Z4789 Encounter for other orthopedic aftercare: Secondary | ICD-10-CM | POA: Diagnosis not present

## 2017-10-28 DIAGNOSIS — S52611D Displaced fracture of right ulna styloid process, subsequent encounter for closed fracture with routine healing: Secondary | ICD-10-CM | POA: Diagnosis not present

## 2017-10-28 DIAGNOSIS — I10 Essential (primary) hypertension: Secondary | ICD-10-CM | POA: Diagnosis not present

## 2017-10-28 DIAGNOSIS — M6281 Muscle weakness (generalized): Secondary | ICD-10-CM | POA: Diagnosis not present

## 2017-10-28 DIAGNOSIS — S52209A Unspecified fracture of shaft of unspecified ulna, initial encounter for closed fracture: Secondary | ICD-10-CM | POA: Diagnosis not present

## 2017-10-28 DIAGNOSIS — R2689 Other abnormalities of gait and mobility: Secondary | ICD-10-CM | POA: Diagnosis not present

## 2017-10-28 DIAGNOSIS — E785 Hyperlipidemia, unspecified: Secondary | ICD-10-CM | POA: Diagnosis not present

## 2017-10-28 DIAGNOSIS — R531 Weakness: Secondary | ICD-10-CM | POA: Diagnosis not present

## 2017-10-28 DIAGNOSIS — K219 Gastro-esophageal reflux disease without esophagitis: Secondary | ICD-10-CM | POA: Diagnosis not present

## 2017-10-28 DIAGNOSIS — S52501S Unspecified fracture of the lower end of right radius, sequela: Secondary | ICD-10-CM | POA: Diagnosis not present

## 2017-10-28 DIAGNOSIS — S0181XA Laceration without foreign body of other part of head, initial encounter: Secondary | ICD-10-CM | POA: Diagnosis not present

## 2017-10-28 NOTE — Telephone Encounter (Signed)
Copied from CRM [phone]#[id number]. Topic: General - Other >> Oct 28, 2017  4:32 PM Caroline LiSimmons, Caroline Sandoval, NT wrote: Reason for CRM: Patients daughter  called and said she was sent to a skilled facility , and her daughter wants to take her home due to the facility changing the Drs orders ,and not caring for the patient , and she wants to know if Dr Patsy Lageropland will manage her care if she takes her mom home to take care of her, she states she will send all of her recent medical records . Please advise [phone]

## 2017-10-29 DIAGNOSIS — S32591S Other specified fracture of right pubis, sequela: Secondary | ICD-10-CM | POA: Diagnosis not present

## 2017-10-29 DIAGNOSIS — S32511S Fracture of superior rim of right pubis, sequela: Secondary | ICD-10-CM | POA: Diagnosis not present

## 2017-10-29 DIAGNOSIS — R531 Weakness: Secondary | ICD-10-CM | POA: Diagnosis not present

## 2017-10-29 DIAGNOSIS — S52611S Displaced fracture of right ulna styloid process, sequela: Secondary | ICD-10-CM | POA: Diagnosis not present

## 2017-10-29 NOTE — Telephone Encounter (Signed)
Please give daughter a call back.  I think that I will be able to take care of her as requested

## 2017-11-03 NOTE — Telephone Encounter (Signed)
Patients daughter notified and verbalized understanding 

## 2017-11-10 DIAGNOSIS — S32511S Fracture of superior rim of right pubis, sequela: Secondary | ICD-10-CM | POA: Diagnosis not present

## 2017-11-10 DIAGNOSIS — R531 Weakness: Secondary | ICD-10-CM | POA: Diagnosis not present

## 2017-11-10 DIAGNOSIS — S52611S Displaced fracture of right ulna styloid process, sequela: Secondary | ICD-10-CM | POA: Diagnosis not present

## 2017-11-10 DIAGNOSIS — S52501S Unspecified fracture of the lower end of right radius, sequela: Secondary | ICD-10-CM | POA: Diagnosis not present

## 2017-11-11 DIAGNOSIS — S52571D Other intraarticular fracture of lower end of right radius, subsequent encounter for closed fracture with routine healing: Secondary | ICD-10-CM | POA: Diagnosis not present

## 2017-11-11 DIAGNOSIS — Z4789 Encounter for other orthopedic aftercare: Secondary | ICD-10-CM | POA: Diagnosis not present

## 2017-11-13 DIAGNOSIS — Z9181 History of falling: Secondary | ICD-10-CM | POA: Diagnosis not present

## 2017-11-13 DIAGNOSIS — M810 Age-related osteoporosis without current pathological fracture: Secondary | ICD-10-CM | POA: Diagnosis not present

## 2017-11-13 DIAGNOSIS — S52501D Unspecified fracture of the lower end of right radius, subsequent encounter for closed fracture with routine healing: Secondary | ICD-10-CM | POA: Diagnosis not present

## 2017-11-13 DIAGNOSIS — Z79891 Long term (current) use of opiate analgesic: Secondary | ICD-10-CM | POA: Diagnosis not present

## 2017-11-13 DIAGNOSIS — S0990XD Unspecified injury of head, subsequent encounter: Secondary | ICD-10-CM | POA: Diagnosis not present

## 2017-11-13 DIAGNOSIS — M1990 Unspecified osteoarthritis, unspecified site: Secondary | ICD-10-CM | POA: Diagnosis not present

## 2017-11-13 DIAGNOSIS — S32512D Fracture of superior rim of left pubis, subsequent encounter for fracture with routine healing: Secondary | ICD-10-CM | POA: Diagnosis not present

## 2017-11-13 DIAGNOSIS — G9009 Other idiopathic peripheral autonomic neuropathy: Secondary | ICD-10-CM | POA: Diagnosis not present

## 2017-11-13 DIAGNOSIS — M47812 Spondylosis without myelopathy or radiculopathy, cervical region: Secondary | ICD-10-CM | POA: Diagnosis not present

## 2017-11-13 DIAGNOSIS — K219 Gastro-esophageal reflux disease without esophagitis: Secondary | ICD-10-CM | POA: Diagnosis not present

## 2017-11-13 DIAGNOSIS — S32592D Other specified fracture of left pubis, subsequent encounter for fracture with routine healing: Secondary | ICD-10-CM | POA: Diagnosis not present

## 2017-11-13 DIAGNOSIS — S52611D Displaced fracture of right ulna styloid process, subsequent encounter for closed fracture with routine healing: Secondary | ICD-10-CM | POA: Diagnosis not present

## 2017-11-13 DIAGNOSIS — S0181XD Laceration without foreign body of other part of head, subsequent encounter: Secondary | ICD-10-CM | POA: Diagnosis not present

## 2017-11-15 ENCOUNTER — Telehealth: Payer: Self-pay | Admitting: Family Medicine

## 2017-11-15 ENCOUNTER — Telehealth: Payer: Self-pay

## 2017-11-15 NOTE — Telephone Encounter (Signed)
Copied from CRM [phone]#[id number]. Topic: Quick Communication - See Telephone Encounter >> Nov 15, 2017 10:30 AM Jens SomMedley, Jennifer A wrote: CRM for notification. See Telephone encounter for: 11/15/17.Raynelle FanningJulie, Nurse from [facility] is calling to request verbal orders for: Nursing 1x week for 1 Week, 2x for 2week, 1x for 2week, 3prns,  Wound Care for the R [place] area Right Forearm incision monitoring  Home Health Aid 2xa week for 2 weeks  Please advise Raynelle FanningJulie Call back [phone]

## 2017-11-15 NOTE — Telephone Encounter (Unsigned)
Copied from CRM [id number]. Topic: General - Other >> Nov 15, 2017 10:36 AM Marylen PontoMcneil, Ja-Kwan wrote: Reason for CRM: Evon SlackBrett Wells with [facility] requests verbal orders for physical therapy for 2 times a week for 3 weeks and 1 time a week for 1 week. Cb# [phone] Ext. [phone] Jobie Quaker(Lisa Gause)

## 2017-11-15 NOTE — Telephone Encounter (Signed)
Verbal order given via voicemail.  

## 2017-11-15 NOTE — Telephone Encounter (Signed)
Copied from CRM [id number]. Topic: General - Other >> Nov 15, 2017  4:33 PM Trula SladeWalter, Linda F wrote: Reason for CRM: Apolinar JunesBrandon OT w/[facility] [phone] would like verbal order for OT for the patient for 2w4 and 1w1

## 2017-11-15 NOTE — Telephone Encounter (Signed)
Verbal orders given via voice-mail.

## 2017-11-15 NOTE — Telephone Encounter (Signed)
Verbal orders given  

## 2017-11-18 ENCOUNTER — Other Ambulatory Visit: Payer: Self-pay | Admitting: *Deleted

## 2017-11-18 NOTE — Patient Outreach (Signed)
[facility] Care Management  11/18/2017  Barbette Merinolizabeth Shank Aug 23, 1940 [id number]   Transition of Care Referral   Referral Date: 11/15/17 Referral Source: [facility] Urgent [facility] Date of Admission: 10/28/17 hospital admission 10/21/17  Diagnosis: Unsp fracture of right wrist and hand, pelvic fx after a fall off a ladder Date of Discharge: 11/12/17 Facility[facility]  [facility] on 11[facility]  Discharged Home with Home Care  Insurance: HTA  Outreach attempt # 1 successful at her mobile number, the attempt at the home number stated an invalid number Patient is able to verify HIPAA Reviewed and addressed Transitional of care referral with patient She denies any concerns that has not been addressed today Transition of care assessment completed without identified needs She confirms she fell [place] a ladder on 10/21/17 [place] and injured her wrist, hand and head (17 stitches to her head)    Social: daughter, Rene KocherRegina, her husband, son, son in law and lots of friends have been supportive and assisted with transportation as needed. She reports she is doing good and is [facility] for [facility], pt/ot  She reports she is right handed and now uses her left hand to write  She has a hard cast on her right wrist She was non wt bearing but is now able to bear wt on her right foot She denies any concerns with noted memory concerns after the head injury  Conditions: 10/21/17 fall x 1 off a ladder with right wrist and hand, pelvic fx , essential tremor, post concussion syndrome, osteopenia, depression      Medications: denies concerns with taking medications as prescribed, affording medications, side effects of medications and questions about medications DME walker with elbow support, commode seat, shower seat from [employer] and family  Appointments: primary care MD J copeland to be seen for follow up care on Monday November 22 2017 She reports  that Dr Dallas Schimkeopeland called to her daughter personally after receiving a call of the fall and Mrs Noe Genseters is very appreciative of Dr Tribune CompanyCopeland's professionalism and kindness She saw the orthopedic/hand specialist Louis MatteKenneth Lennon, 11/11/17 before left snf will follow back up on  12/07/17   Advance directives: Denies need for assist with or assist with changes for advance directives    [facility]ent: [facility] with patient. Patient gave verbal consent for services. She denies need of services from [facility] Community/Telephonic RN CM, pharmacy, health coach, NP or SW at this time  Advised patient that other post discharge calls may occur to assess how the patient is doing following the recent hospitalization. Patient voiced understanding and was appreciative of f/u call.  Plan: [facility] CM will close case at this time as patient has been assessed and no needs identified.   [facility] RN CM sent a successful outreach letter as discussed with [facility] brochure enclosed for review Encouraged to contact [facility] for any needs  Vadis Slabach L. Noelle PennerGibbs, RN, BSN, CCM [facility] Telephonic Care Management Care Coordinator Office number [phone] Mobile number [phone]  Main [facility] number [phone] number [phone]

## 2017-11-20 NOTE — Progress Notes (Addendum)
[facility] [address], [place], [place] [address] [phone]  Date:  11/22/2017   Name:  Caroline Sandoval   DOB:  1940-07-10   MRN:  [id number]  PCP:  Darreld Mclean, MD    Chief Complaint: Hospitalization Follow-up (fall injury, rehab)   History of Present Illness:  Caroline Sandoval is a 77 y.o. very pleasant female patient who presents with the following:  I last saw this pt about a year ago She is following up today from a fall which occurred while she was at the beach over the summer on 8/21  Here today with her daughter Rollene Fare Pt was in the garage while at the beach putting up a storage tote- she was on a 6 foot ladder and fell off [address]- cut her head, broke her right wrist and also broke her pelvis in 2 places She was admitted and treated at [facility] She was admitted for a bit over a week She had surgery on her wrist - she is seeing a Copy in [facility] for follow-up but not a general orthopedist She was then in rehab for 2 weeks, released to home  She lives in [place] but was at her beach house when this occurred.  She is living in [facility] now still with her daughter- she actually plans to move to [facility] on a permament basis over the near year  She did have a scan of her head in the hospital- CT we think- and it was ok as far as she knows   Her pelvis is healing ok she thinks- her pain is more when she is walking  She has a walker now that she is using since she fell- it is set up so that she can use it with her broken right forearm  Pt also notes that she is having some electrical shock type "zings" that seem to run from the left side of her head to the right side.  Has noted these for a couple of months.   May occur several times a day, but not all day.  Can be more or less frequent depending on the day Not painful- "just weird" May have some tinnitus No other neuro findings She did run out of  her fluoxetine about a month ago- ?this could be the reason why No other news meds really  We will go back on this now  She needs a refill of her boniva as well She is taking oxycodone and flexeril as needed for her pain and needs refills  Taking oxycodone at bedtime and sometimes once during the day  She has been under stress- her husband is not in good health and they are moving to [facility] on a permanent basis  Her husband is also my patient.    Flu shot today   [facility]:  20 oxycodone filled about 10 days ago Tried to search [place] but could not get this to work   She does have osteopenia and has been on Boniva  BP Readings from Last 3 Encounters:  11/22/17 120/70  04/16/17 (!) 115/59  01/27/17 110/60     Patient Active Problem List   Diagnosis Date Noted  . Osteopenia 06/09/2016  . Reactive depression 06/04/2016  . Vertigo 05/29/2015  . Essential tremor 05/29/2015  . Post concussion syndrome 05/29/2015    Past Medical History:  Diagnosis Date  . Headache   . Shingles  Past Surgical History:  Procedure Laterality Date  . ABDOMINAL HYSTERECTOMY    . CHOLECYSTECTOMY    . TOE SURGERY Bilateral    in-grown toe nails  . WRIST FRACTURE SURGERY  10/21/2017    Social History   Tobacco Use  . Smoking status: Never Smoker  . Smokeless tobacco: Never Used  Substance Use Topics  . Alcohol use: No    Alcohol/week: 0.0 standard drinks  . Drug use: No    Family History  Problem Relation Age of Onset  . Pulmonary fibrosis Mother   . Idiopathic pulmonary fibrosis Mother   . Bladder Cancer Father   . Cancer Father        Bladder cancer  . Heart attack Brother   . Pulmonary fibrosis Brother   . Rheum arthritis Brother   . Idiopathic pulmonary fibrosis Brother   . Arthritis/Rheumatoid Brother   . Pulmonary fibrosis Brother   . Hypertension Brother   . Arthritis Brother   . Neuropathy Neg Hx   . Stroke Neg Hx   . Ataxia Neg Hx     Allergies  Allergen Reactions  .  Morphine     Drops BP and HR. Per pt    Medication list has been reviewed and updated.  Current Outpatient Medications on File Prior to Visit  Medication Sig Dispense Refill  . cyclobenzaprine (FLEXERIL) 5 MG tablet Take 5 mg by mouth daily as needed.  0  . gabapentin (NEURONTIN) 300 MG capsule TAKE ONE CAPSULE BY MOUTH EVERY 8 HOURS  0  . ibuprofen (ADVIL,MOTRIN) 600 MG tablet Take 600 mg by mouth.    . pantoprazole (PROTONIX) 40 MG tablet Take 40 mg by mouth.     No current facility-administered medications on file prior to visit.     Review of Systems:  As per HPI- otherwise negative. No fever or chills Her right arm is getting better   Physical Examination: Vitals:   11/22/17 1224 11/22/17 1311  BP: (!) 104/54 120/70  Pulse: 83   Resp: 18   Temp: (!) 97.5 F (36.4 C)   SpO2: 98%    Vitals:   11/22/17 1224  Weight: 127 lb (57.6 kg)  Height: _0  (1.549 m)   Body mass index is 24 kg/m. Ideal Body Weight: Weight in (lb) to have BMI = 25: 132  GEN: WDWN, NAD, Non-toxic, A & O x 3, looks well, accompanied by her daughter today  HEENT: Atraumatic, Normocephalic. Neck supple. No masses, No LAD.  Bilateral TM wnl, oropharynx normal.  PEERL,EOMI.   Ears and Nose: No external deformity. CV: RRR, No M/G/R. No JVD. No thrill. No extra heart sounds. PULM: CTA B, no wheezes, crackles, rhonchi. No retractions. No resp. distress. No accessory muscle use. ABD: S, NT, ND EXTR: No c/c/e NEURO using a walker but getting around at a decent pace Right arm in a short arm cast Fingers with normal sensation and cap refill Normal sensation of all limbs  PSYCH: Normally interactive. Conversant. Not depressed or anxious appearing.  Calm demeanor.    Assessment and Plan: Reactive depression - Plan: FLUoxetine (PROZAC) 20 MG tablet  Estrogen deficiency - Plan: ibandronate (BONIVA) 150 MG tablet  Osteopenia determined by x-ray - Plan: ibandronate (BONIVA) 150 MG tablet  Need for  influenza vaccination - Plan: Flu vaccine HIGH DOSE PF (Fluzone High dose)  Closed fracture of right wrist with routine healing, subsequent encounter - Plan: oxyCODONE (OXY IR/ROXICODONE) 5 MG immediate release tablet  Numbness and tingling -  Plan: Comprehensive metabolic panel, TSH, D62, Folate  Screening for deficiency anemia - Plan: CBC  Following up from recnet accident today She is healing well She is seeing a hand surgeon- asked her to query him about seeing a general ortho about her pelvis as well Pain- refilled her oxycodone, discussed minimizing use of this med Zings may be due to SSRi withdrawal.  Restart prozac, and get her admission records She will let me know if restarting her SSRI resolved this issue Will plan further follow- up pending labs. Requested records today   It was good to see you today! I am so sorry about your recnet injuries. I am going to request your hospital records from [facility] to fill in some of our blanks here Your "zings" may be due to stopping the prozac.  Let's get you back started on this and see if this problem resolves.  Will also get some labs for you today. If your zings do not go away we may need a neurology consultation and/ or an MRI  Please let me know what happens here Please mention your pelvis fracture to your orthopedist- he may need to have you see one of his partners about this since he is a hand specialist  Continue to use pain meds as needed- use as little as you can.  You can try taking a 1/2 dose when you are ready   Signed Lamar Blinks, MD  Received her labs 9/24- message to pt  Results for orders placed or performed in visit on 11/22/17  CBC  Result Value Ref Range   WBC 6.8 4.0 - 10.5 K/uL   RBC 4.43 3.87 - 5.11 Mil/uL   Platelets 225.0 150.0 - 400.0 K/uL   Hemoglobin 12.8 12.0 - 15.0 g/dL   HCT 38.7 36.0 - 46.0 %   MCV 87.3 78.0 - 100.0 fl   MCHC 33.1 30.0 - 36.0 g/dL   RDW 14.7 11.5 - 15.5 %  Comprehensive metabolic  panel  Result Value Ref Range   Sodium 141 135 - 145 mEq/L   Potassium 5.4 (H) 3.5 - 5.1 mEq/L   Chloride 106 96 - 112 mEq/L   CO2 29 19 - 32 mEq/L   Glucose, Bld 89 70 - 99 mg/dL   BUN 18 6 - 23 mg/dL   Creatinine, Ser 0.90 0.40 - 1.20 mg/dL   Total Bilirubin 0.4 0.2 - 1.2 mg/dL   Alkaline Phosphatase 143 (H) 39 - 117 U/L   AST 25 0 - 37 U/L   ALT 20 0 - 35 U/L   Total Protein 6.8 6.0 - 8.3 g/dL   Albumin 4.3 3.5 - 5.2 g/dL   Calcium 9.9 8.4 - 10.5 mg/dL   GFR 64.61 >60.00 mL/min  TSH  Result Value Ref Range   TSH 2.94 0.35 - 4.50 uIU/mL  B12  Result Value Ref Range   Vitamin B-12 566 211 - 911 pg/mL  Folate  Result Value Ref Range   Folate 6.0 >5.9 ng/mL   Thyroid, folate and B12 are all normal Blood counts are normal 2 more minor things- your potassium and alk phos levels are high.   The alk phos is a marker of bone metabolism and liver metabolism- I suspect that this is high as your body is healing your broken bones Your high potassium may be due to blood processing- sometimes blood cells break open and release their potassium.  Your potassium is not terribly high, but we do want to see this go  back to normal.  Is there a place where you could have this test repeated in the next week or two at your daughter's home?  Please let me know and I am glad to send an order for you Take care!  Let's plan to visit in 4 months or so

## 2017-11-22 ENCOUNTER — Ambulatory Visit (INDEPENDENT_AMBULATORY_CARE_PROVIDER_SITE_OTHER): Payer: PPO | Admitting: Family Medicine

## 2017-11-22 ENCOUNTER — Encounter: Payer: Self-pay | Admitting: Family Medicine

## 2017-11-22 VITALS — BP 120/70 | HR 83 | Temp 97.5°F | Resp 18 | Ht 61.0 in | Wt 127.0 lb

## 2017-11-22 DIAGNOSIS — E2839 Other primary ovarian failure: Secondary | ICD-10-CM

## 2017-11-22 DIAGNOSIS — R200 Anesthesia of skin: Secondary | ICD-10-CM | POA: Diagnosis not present

## 2017-11-22 DIAGNOSIS — Z130 Encounter for screening for diseases of the blood and blood-forming organs and certain disorders involving the immune mechanism: Secondary | ICD-10-CM

## 2017-11-22 DIAGNOSIS — R202 Paresthesia of skin: Secondary | ICD-10-CM | POA: Diagnosis not present

## 2017-11-22 DIAGNOSIS — M8580 Other specified disorders of bone density and structure, unspecified site: Secondary | ICD-10-CM

## 2017-11-22 DIAGNOSIS — S62101D Fracture of unspecified carpal bone, right wrist, subsequent encounter for fracture with routine healing: Secondary | ICD-10-CM

## 2017-11-22 DIAGNOSIS — Z23 Encounter for immunization: Secondary | ICD-10-CM

## 2017-11-22 DIAGNOSIS — F329 Major depressive disorder, single episode, unspecified: Secondary | ICD-10-CM

## 2017-11-22 LAB — COMPREHENSIVE METABOLIC PANEL
ALT: 20 U/L (ref 0–35)
AST: 25 U/L (ref 0–37)
Albumin: 4.3 g/dL (ref 3.5–5.2)
Alkaline Phosphatase: 143 U/L — ABNORMAL HIGH (ref 39–117)
BUN: 18 mg/dL (ref 6–23)
CALCIUM: 9.9 mg/dL (ref 8.4–10.5)
CO2: 29 meq/L (ref 19–32)
CREATININE: 0.9 mg/dL (ref 0.40–1.20)
Chloride: 106 mEq/L (ref 96–112)
GFR: 64.61 mL/min (ref >60.00)
GLUCOSE: 89 mg/dL (ref 70–99)
Potassium: 5.4 mEq/L — ABNORMAL HIGH (ref 3.5–5.1)
SODIUM: 141 meq/L (ref 135–145)
Total Bilirubin: 0.4 mg/dL (ref 0.2–1.2)
Total Protein: 6.8 g/dL (ref 6.0–8.3)

## 2017-11-22 LAB — CBC
HCT: 38.7 % (ref 36.0–46.0)
Hemoglobin: 12.8 g/dL (ref 12.0–15.0)
MCHC: 33.1 g/dL (ref 30.0–36.0)
MCV: 87.3 fl (ref 78.0–100.0)
PLATELETS: 225 10*3/uL (ref 150.0–400.0)
RBC: 4.43 Mil/uL (ref 3.87–5.11)
RDW: 14.7 % (ref 11.5–15.5)
WBC: 6.8 10*3/uL (ref 4.0–10.5)

## 2017-11-22 LAB — FOLATE: Folate: 6 ng/mL (ref >5.9)

## 2017-11-22 LAB — TSH: TSH: 2.94 u[IU]/mL (ref 0.35–4.50)

## 2017-11-22 LAB — VITAMIN B12: VITAMIN B 12: 566 pg/mL (ref 211–911)

## 2017-11-22 MED ORDER — OXYCODONE HCL 5 MG PO TABS: 5.0000 mg | ORAL_TABLET | Freq: Four times a day (QID) | ORAL | 0 refills | Status: DC | PRN

## 2017-11-22 MED ORDER — IBANDRONATE SODIUM 150 MG PO TABS
150.0000 mg | ORAL_TABLET | ORAL | 4 refills | Status: AC
Start: 2017-11-22 — End: ?

## 2017-11-22 MED ORDER — FLUOXETINE HCL 20 MG PO TABS: 20.0000 mg | ORAL_TABLET | Freq: Every day | ORAL | 3 refills | Status: DC

## 2017-11-22 NOTE — Patient Instructions (Addendum)
It was good to see you today! I am so sorry about your recnet injuries. I am going to request your hospital records [facility] to fill in some of our blanks here Your "zings" may be due to stopping the prozac.  Let's get you back started on this and see if this problem resolves.  Will also get some labs for you today. If your zings do not go away we may need a neurology consultation and/ or an MRI  Please let me know what happens here Please mention your pelvis fracture to your orthopedist- he may need to have you see one of his partners about this since he is a hand specialist   Continue to use pain meds as needed- use as little as you can.  You can try taking a 1/2 dose when you are ready

## 2017-11-22 NOTE — Addendum Note (Signed)
Addended by: Abbe AmsterdamOPLAND, Jebadiah Imperato C on: 11/22/2017 08:26 PM   Modules accepted: Orders

## 2017-11-23 ENCOUNTER — Encounter: Payer: Self-pay | Admitting: Family Medicine

## 2017-11-23 MED ORDER — GABAPENTIN 300 MG PO CAPS: 300.0000 mg | ORAL_CAPSULE | Freq: Two times a day (BID) | ORAL | 5 refills | Status: DC

## 2017-11-23 MED ORDER — GABAPENTIN 300 MG PO CAPS: 300.0000 mg | ORAL_CAPSULE | Freq: Two times a day (BID) | ORAL | 5 refills | Status: AC

## 2017-11-23 NOTE — Addendum Note (Signed)
Addended by: Abbe AmsterdamOPLAND, JESSICA C on: 11/23/2017 09:14 AM   Modules accepted: Orders

## 2017-11-26 ENCOUNTER — Telehealth: Payer: Self-pay | Admitting: Family Medicine

## 2017-11-26 NOTE — Telephone Encounter (Signed)
Received records from [facility] - will abstract and scan as appropriate

## 2017-11-27 ENCOUNTER — Encounter: Payer: Self-pay | Admitting: Family Medicine

## 2017-11-29 ENCOUNTER — Telehealth: Payer: Self-pay | Admitting: *Deleted

## 2017-11-29 NOTE — Telephone Encounter (Signed)
.  Received Home Health Certification and Plan of Care; forwarded to provider/SLS 09/30   

## 2017-12-07 DIAGNOSIS — Z4789 Encounter for other orthopedic aftercare: Secondary | ICD-10-CM | POA: Diagnosis not present

## 2017-12-07 DIAGNOSIS — S32519A Fracture of superior rim of unspecified pubis, initial encounter for closed fracture: Secondary | ICD-10-CM | POA: Diagnosis not present

## 2017-12-07 DIAGNOSIS — S32599A Other specified fracture of unspecified pubis, initial encounter for closed fracture: Secondary | ICD-10-CM | POA: Diagnosis not present

## 2017-12-07 DIAGNOSIS — S52571D Other intraarticular fracture of lower end of right radius, subsequent encounter for closed fracture with routine healing: Secondary | ICD-10-CM | POA: Diagnosis not present

## 2017-12-22 ENCOUNTER — Telehealth: Payer: Self-pay | Admitting: *Deleted

## 2017-12-22 NOTE — Telephone Encounter (Signed)
Received Discharge/Transfer Summary from [facility] for review; forwarded to provider/SLS 10/23

## 2017-12-30 ENCOUNTER — Encounter: Payer: Self-pay | Admitting: Family Medicine

## 2017-12-30 ENCOUNTER — Ambulatory Visit (INDEPENDENT_AMBULATORY_CARE_PROVIDER_SITE_OTHER): Payer: PPO | Admitting: Family Medicine

## 2017-12-30 VITALS — BP 112/68 | HR 73 | Temp 97.8°F | Resp 16 | Ht 61.0 in | Wt 128.0 lb

## 2017-12-30 DIAGNOSIS — Z5181 Encounter for therapeutic drug level monitoring: Secondary | ICD-10-CM

## 2017-12-30 DIAGNOSIS — Z13220 Encounter for screening for lipoid disorders: Secondary | ICD-10-CM

## 2017-12-30 DIAGNOSIS — N3000 Acute cystitis without hematuria: Secondary | ICD-10-CM | POA: Diagnosis not present

## 2017-12-30 DIAGNOSIS — M8580 Other specified disorders of bone density and structure, unspecified site: Secondary | ICD-10-CM | POA: Diagnosis not present

## 2017-12-30 DIAGNOSIS — S62101D Fracture of unspecified carpal bone, right wrist, subsequent encounter for fracture with routine healing: Secondary | ICD-10-CM | POA: Diagnosis not present

## 2017-12-30 DIAGNOSIS — Z0000 Encounter for general adult medical examination without abnormal findings: Secondary | ICD-10-CM | POA: Diagnosis not present

## 2017-12-30 DIAGNOSIS — Z130 Encounter for screening for diseases of the blood and blood-forming organs and certain disorders involving the immune mechanism: Secondary | ICD-10-CM | POA: Diagnosis not present

## 2017-12-30 DIAGNOSIS — R748 Abnormal levels of other serum enzymes: Secondary | ICD-10-CM

## 2017-12-30 DIAGNOSIS — G44329 Chronic post-traumatic headache, not intractable: Secondary | ICD-10-CM | POA: Diagnosis not present

## 2017-12-30 DIAGNOSIS — R300 Dysuria: Secondary | ICD-10-CM | POA: Diagnosis not present

## 2017-12-30 DIAGNOSIS — Z1231 Encounter for screening mammogram for malignant neoplasm of breast: Secondary | ICD-10-CM | POA: Diagnosis not present

## 2017-12-30 LAB — CBC
HCT: 40 % (ref 36.0–46.0)
HEMOGLOBIN: 13.2 g/dL (ref 12.0–15.0)
MCHC: 33.1 g/dL (ref 30.0–36.0)
MCV: 88.4 fl (ref 78.0–100.0)
Platelets: 274 10*3/uL (ref 150.0–400.0)
RBC: 4.53 Mil/uL (ref 3.87–5.11)
RDW: 15.4 % (ref 11.5–15.5)
WBC: 7.4 10*3/uL (ref 4.0–10.5)

## 2017-12-30 LAB — POCT URINALYSIS DIP (MANUAL ENTRY)
BILIRUBIN UA: NEGATIVE mg/dL
Bilirubin, UA: NEGATIVE
Glucose, UA: NEGATIVE mg/dL
Nitrite, UA: NEGATIVE
PH UA: 7 (ref 5.0–8.0)
PROTEIN UA: NEGATIVE mg/dL
RBC UA: NEGATIVE
SPEC GRAV UA: 1.01 (ref 1.010–1.025)
Urobilinogen, UA: 0.2 E.U./dL

## 2017-12-30 LAB — LIPID PANEL
CHOLESTEROL: 204 mg/dL — AB (ref 0–200)
HDL: 60.8 mg/dL (ref >39.00)
NonHDL: 142.92
Total CHOL/HDL Ratio: 3
Triglycerides: 218 mg/dL — ABNORMAL HIGH (ref 0.0–149.0)
VLDL: 43.6 mg/dL — ABNORMAL HIGH (ref 0.0–40.0)

## 2017-12-30 LAB — LDL CHOLESTEROL, DIRECT: Direct LDL: 110 mg/dL

## 2017-12-30 LAB — ALKALINE PHOSPHATASE: ALK PHOS: 83 U/L (ref 39–117)

## 2017-12-30 NOTE — Progress Notes (Addendum)
[facility] [place], [place], [place] [address] [phone]  Date:  12/30/2017   Name:  Caroline Sandoval   DOB:  08-Aug-1940   MRN:  [id number]  PCP:  Darreld Mclean, MD    Chief Complaint: Annual Exam (had flu shot) and Urinary Tract Infection (burning with urination, had cath with surgery since then had trouble emptying bladder, no hematuria)   History of Present Illness:  Caroline Sandoval is a 77 y.o. very pleasant female patient who presents with the following:  Here today for a CPE She has concern of UTI as well History of osteopenia, depression, tremor  I last saw her about 6 weeks ago following a fall that occurred when she was out of town in August- she fractured her right wrist, her pelvis and hit her head.   Pt was in the garage while at the beach putting up a storage tote- she was on a 6 foot ladder and fell off [address]- cut her head, broke her right wrist and also broke her pelvis in 2 places She was admitted and treated at [facility] She was admitted for a bit over a week She had surgery on her wrist - she is seeing a Copy in [facility] for follow-up but not a general orthopedist She was then in rehab for 2 weeks, released to home She had noted a "zing" feeling in her head at that time which we thought might be due to coming off her prozac- we re-started this med for her, zings are much improved   Labs:  Needs cholesterol, CBC, alk phos today Mammo: 2017- scheduled for today  Dexa: 2018 Colon: 2018 [facility]  Immun: UTD except shingrix   Her hand surgery was done in [facility]- on 8/22.  She would like to have some hand therapy for ROM and strength  Also,she notes that her gait is a bit stiff - PT can likely help with this as well   Her surgeon was Jillene Bucks with [facility]   She is on Time for osteopenia with high fracture risk, we will repeat a bone density next  year  2018: Major Osteoporotic Fracture: 18.1% Hip Fracture:                3.8% Population:                  [place] (Caucasian) Risk Factors:                History of Fracture (Adult  Since her operation she notes a feeling of dysuria.  Also she feels like she has to go again just after voiding. Has noted this for a couple of months, ?since she had operation with a catheter in place  She notes that she sometimes may feel dizzy with movement of her head. Turning over in bed, looking up into a closet might trigger her sx New daily headaches in the right temple area- this is a new finding since her fall No vomiting No stroke sx.  All of the neuro sx noted above are since she fell in August of this year   Martin Majestic over email I had sent her on 9/28 [facility]- I was going through your records from [facility] and had a question about the CT head/neck that was done. The CT report mentions a possible cavernous hemangioma- a blood vessel "lake" in the back  of your head- which can sometimes cause symptoms. There was a lot going on at the time, but do you recall anyone mentioning this to you? We might consider doing an MRI to see if you actually have this condition- we had mentioned doing an MRI anyway as you recall.    Pt has NOT seen this email.  Upon discussion she would like to do an MRI which is quite reasonable.   Discussed with radiology who recommended MRI brain with and without, which we will order for her  Patient Active Problem List   Diagnosis Date Noted  . Osteopenia 06/09/2016  . Reactive depression 06/04/2016  . Vertigo 05/29/2015  . Essential tremor 05/29/2015  . Post concussion syndrome 05/29/2015    Past Medical History:  Diagnosis Date  . Fall at home    pt fell at home 10/20/17, was admitted to hospital in [facility].  She suffered a right radial/ ulnar fracture and pelvic fractures  . Headache   . Shingles     Past Surgical History:  Procedure Laterality Date  . ABDOMINAL HYSTERECTOMY     . CHOLECYSTECTOMY    . ORIF RADIUS & ULNA FRACTURES Right 10/21/2017   at [facility] in [place] Bilateral    in-grown toe nails  . WRIST FRACTURE SURGERY  10/21/2017    Social History   Tobacco Use  . Smoking status: Never Smoker  . Smokeless tobacco: Never Used  Substance Use Topics  . Alcohol use: No    Alcohol/week: 0.0 standard drinks  . Drug use: No    Family History  Problem Relation Age of Onset  . Pulmonary fibrosis Mother   . Idiopathic pulmonary fibrosis Mother   . Bladder Cancer Father   . Cancer Father        Bladder cancer  . Heart attack Brother   . Pulmonary fibrosis Brother   . Rheum arthritis Brother   . Idiopathic pulmonary fibrosis Brother   . Arthritis/Rheumatoid Brother   . Pulmonary fibrosis Brother   . Hypertension Brother   . Arthritis Brother   . Neuropathy Neg Hx   . Stroke Neg Hx   . Ataxia Neg Hx     Allergies  Allergen Reactions  . Morphine     Drops BP and HR. Per pt    Medication list has been reviewed and updated.  Current Outpatient Medications on File Prior to Visit  Medication Sig Dispense Refill  . FLUoxetine (PROZAC) 20 MG tablet Take 1 tablet (20 mg total) by mouth daily. 90 tablet 3  . gabapentin (NEURONTIN) 300 MG capsule Take 1 capsule (300 mg total) by mouth 2 (two) times daily. 60 capsule 5  . ibandronate (BONIVA) 150 MG tablet Take 1 tablet (150 mg total) by mouth every 30 (thirty) days. Take in the morning with a full glass of water, on an empty stomach. Don't lie down for 30 min 3 tablet 4  . ibuprofen (ADVIL,MOTRIN) 600 MG tablet Take 600 mg by mouth.    . pantoprazole (PROTONIX) 40 MG tablet Take 40 mg by mouth.     No current facility-administered medications on file prior to visit.     Review of Systems:  As per HPI- otherwise negative. No fever or chills No CP or SOB No PMB  Physical Examination: Vitals:   12/30/17 1005  BP: 112/68  Pulse: 73  Resp: 16   Temp: 97.8 F (36.6 C)  SpO2: 97%   Vitals:  12/30/17 1005  Weight: 128 lb (58.1 kg)  Height: 5' 1"  (1.549 m)   Body mass index is 24.19 kg/m. Ideal Body Weight: Weight in (lb) to have BMI = 25: 132  GEN: WDWN, NAD, Non-toxic, A & O x 3, looks well and stated age  HEENT: Atraumatic, Normocephalic. Neck supple. No masses, No LAD.  Bilateral TM wnl, oropharynx normal.  PEERL,EOMI.   Ears and Nose: No external deformity. CV: RRR, No M/G/R. No JVD. No thrill. No extra heart sounds. PULM: CTA B, no wheezes, crackles, rhonchi. No retractions. No resp. distress. No accessory muscle use. ABD: S, NT, ND EXTR: No c/c/e NEURO Normal gait. Normal strength and movement of all limbs PSYCH: Normally interactive. Conversant. Not depressed or anxious appearing.  Calm demeanor.  Right wrist displays a well healing surgical scar and has mildly decreased flexion   Results for orders placed or performed in visit on 12/30/17  CBC  Result Value Ref Range   WBC 7.4 4.0 - 10.5 K/uL   RBC 4.53 3.87 - 5.11 Mil/uL   Platelets 274.0 150.0 - 400.0 K/uL   Hemoglobin 13.2 12.0 - 15.0 g/dL   HCT 40.0 36.0 - 46.0 %   MCV 88.4 78.0 - 100.0 fl   MCHC 33.1 30.0 - 36.0 g/dL   RDW 15.4 11.5 - 15.5 %  Lipid panel  Result Value Ref Range   Cholesterol 204 (H) 0 - 200 mg/dL   Triglycerides 218.0 (H) 0.0 - 149.0 mg/dL   HDL 60.80 >39.00 mg/dL   VLDL 43.6 (H) 0.0 - 40.0 mg/dL   Total CHOL/HDL Ratio 3    NonHDL 142.92   Alkaline phosphatase  Result Value Ref Range   Alkaline Phosphatase 83 39 - 117 U/L  LDL cholesterol, direct  Result Value Ref Range   Direct LDL 110.0 mg/dL  POCT urinalysis dipstick  Result Value Ref Range   Color, UA yellow yellow   Clarity, UA cloudy (A) clear   Glucose, UA negative negative mg/dL   Bilirubin, UA negative negative   Ketones, POC UA negative negative mg/dL   Spec Grav, UA 1.010 1.010 - 1.025   Blood, UA negative negative   pH, UA 7.0 5.0 - 8.0   Protein Ur, POC  negative negative mg/dL   Urobilinogen, UA 0.2 0.2 or 1.0 E.U./dL   Nitrite, UA Negative Negative   Leukocytes, UA Small (1+) (A) Negative    Assessment and Plan: Physical exam  Dysuria - Plan: Urine Culture, POCT urinalysis dipstick  Screening for hyperlipidemia - Plan: Lipid panel  Screening for deficiency anemia - Plan: CBC  Elevated alkaline phosphatase level - Plan: Alkaline phosphatase  Osteopenia determined by x-ray  Closed fracture of right wrist with routine healing, subsequent encounter - Plan: Ambulatory referral to Physical Therapy  Medication monitoring encounter - Plan: Basic metabolic panel  Chronic post-traumatic headache, not intractable - Plan: MR Brain W Wo Contrast  Following up today- CPE Health maint is UTD Repeat labs as above She would like to go to PT (and has an rx for same from her orthopedist in [facility])- will make referral for her Will set up MRI to eval for possible cavernous hemangioma seen  On CT at OSH in setting of mild vertigo and headaches   Signed Lamar Blinks, MD  Received her labs so far Results for orders placed or performed in visit on 12/30/17  CBC  Result Value Ref Range   WBC 7.4 4.0 - 10.5 K/uL   RBC 4.53 3.87 -  5.11 Mil/uL   Platelets 274.0 150.0 - 400.0 K/uL   Hemoglobin 13.2 12.0 - 15.0 g/dL   HCT 40.0 36.0 - 46.0 %   MCV 88.4 78.0 - 100.0 fl   MCHC 33.1 30.0 - 36.0 g/dL   RDW 15.4 11.5 - 15.5 %  Lipid panel  Result Value Ref Range   Cholesterol 204 (H) 0 - 200 mg/dL   Triglycerides 218.0 (H) 0.0 - 149.0 mg/dL   HDL 60.80 >39.00 mg/dL   VLDL 43.6 (H) 0.0 - 40.0 mg/dL   Total CHOL/HDL Ratio 3    NonHDL 142.92   Alkaline phosphatase  Result Value Ref Range   Alkaline Phosphatase 83 39 - 117 U/L  LDL cholesterol, direct  Result Value Ref Range   Direct LDL 110.0 mg/dL  POCT urinalysis dipstick  Result Value Ref Range   Color, UA yellow yellow   Clarity, UA cloudy (A) clear   Glucose, UA negative negative mg/dL    Bilirubin, UA negative negative   Ketones, POC UA negative negative mg/dL   Spec Grav, UA 1.010 1.010 - 1.025   Blood, UA negative negative   pH, UA 7.0 5.0 - 8.0   Protein Ur, POC negative negative mg/dL   Urobilinogen, UA 0.2 0.2 or 1.0 E.U./dL   Nitrite, UA Negative Negative   Leukocytes, UA Small (1+) (A) Negative    The 10-year ASCVD risk score Mikey Bussing DC Jr., et al., 2013) is: 14.1%   Values used to calculate the score:     Age: 75 years     Sex: Female     Is Non-Hispanic African American: No     Diabetic: No     Tobacco smoker: No     Systolic Blood Pressure: 299 mmHg     Is BP treated: No     HDL Cholesterol: 60.8 mg/dL     Total Cholesterol: 204 mg/dL  Received her urine culture 11/3- Called pt and LMOM- she does have a UTI Sent in rx for amoxicillin for her 500 BID for 5 days

## 2017-12-30 NOTE — Patient Instructions (Signed)
Your urine shows possible infection- I will send it for a culture and be in touch with final report- we will also get your blood work back to you asap I have referred you to PT to work on your wrist and your walking/ pelvis We are going to do an MRI of your head to evaluate for any abnormality I will set this up for you Please consider getting the shingrix vaccine at your convenience for shingles- at your drug store  We will do a repeat bone density for you next year   Health Maintenance, Female Adopting a healthy lifestyle and getting preventive care can go a long way to promote health and wellness. Talk with your health care provider about what schedule of regular examinations is right for you. This is a good chance for you to check in with your provider about disease prevention and staying healthy. In between checkups, there are plenty of things you can do on your own. Experts have done a lot of research about which lifestyle changes and preventive measures are most likely to keep you healthy. Ask your health care provider for more information. Weight and diet Eat a healthy diet  Be sure to include plenty of vegetables, fruits, low-fat dairy products, and lean protein.  Do not eat a lot of foods high in solid fats, added sugars, or salt.  Get regular exercise. This is one of the most important things you can do for your health. ? Most adults should exercise for at least 150 minutes each week. The exercise should increase your heart rate and make you sweat (moderate-intensity exercise). ? Most adults should also do strengthening exercises at least twice a week. This is in addition to the moderate-intensity exercise.  Maintain a healthy weight  Body mass index (BMI) is a measurement that can be used to identify possible weight problems. It estimates body fat based on height and weight. Your health care provider can help determine your BMI and help you achieve or maintain a healthy  weight.  For females 75 years of age and older: ? A BMI below 18.5 is considered underweight. ? A BMI of 18.5 to 24.9 is normal. ? A BMI of 25 to 29.9 is considered overweight. ? A BMI of 30 and above is considered obese.  Watch levels of cholesterol and blood lipids  You should start having your blood tested for lipids and cholesterol at 76 years of age, then have this test every 5 years.  You may need to have your cholesterol levels checked more often if: ? Your lipid or cholesterol levels are high. ? You are older than 76 years of age. ? You are at high risk for heart disease.  Cancer screening Lung Cancer  Lung cancer screening is recommended for adults 65-40 years old who are at high risk for lung cancer because of a history of smoking.  A yearly low-dose CT scan of the lungs is recommended for people who: ? Currently smoke. ? Have quit within the past 15 years. ? Have at least a 30-pack-year history of smoking. A pack year is smoking an average of one pack of cigarettes a day for 1 year.  Yearly screening should continue until it has been 15 years since you quit.  Yearly screening should stop if you develop a health problem that would prevent you from having lung cancer treatment.  Breast Cancer  Practice breast self-awareness. This means understanding how your breasts normally appear and feel.  It also means  doing regular breast self-exams. Let your health care provider know about any changes, no matter how small.  If you are in your 20s or 30s, you should have a clinical breast exam (CBE) by a health care provider every 1-3 years as part of a regular health exam.  If you are 5 or older, have a CBE every year. Also consider having a breast X-ray (mammogram) every year.  If you have a family history of breast cancer, talk to your health care provider about genetic screening.  If you are at high risk for breast cancer, talk to your health care provider about having an  MRI and a mammogram every year.  Breast cancer gene (BRCA) assessment is recommended for women who have family members with BRCA-related cancers. BRCA-related cancers include: ? Breast. ? Ovarian. ? Tubal. ? Peritoneal cancers.  Results of the assessment will determine the need for genetic counseling and BRCA1 and BRCA2 testing.  Cervical Cancer Your health care provider may recommend that you be screened regularly for cancer of the pelvic organs (ovaries, uterus, and vagina). This screening involves a pelvic examination, including checking for microscopic changes to the surface of your cervix (Pap test). You may be encouraged to have this screening done every 3 years, beginning at age 35.  For women ages 5-65, health care providers may recommend pelvic exams and Pap testing every 3 years, or they may recommend the Pap and pelvic exam, combined with testing for human papilloma virus (HPV), every 5 years. Some types of HPV increase your risk of cervical cancer. Testing for HPV may also be done on women of any age with unclear Pap test results.  Other health care providers may not recommend any screening for nonpregnant women who are considered low risk for pelvic cancer and who do not have symptoms. Ask your health care provider if a screening pelvic exam is right for you.  If you have had past treatment for cervical cancer or a condition that could lead to cancer, you need Pap tests and screening for cancer for at least 20 years after your treatment. If Pap tests have been discontinued, your risk factors (such as having a new sexual partner) need to be reassessed to determine if screening should resume. Some women have medical problems that increase the chance of getting cervical cancer. In these cases, your health care provider may recommend more frequent screening and Pap tests.  Colorectal Cancer  This type of cancer can be detected and often prevented.  Routine colorectal cancer screening  usually begins at 76 years of age and continues through 76 years of age.  Your health care provider may recommend screening at an earlier age if you have risk factors for colon cancer.  Your health care provider may also recommend using home test kits to check for hidden blood in the stool.  A small camera at the end of a tube can be used to examine your colon directly (sigmoidoscopy or colonoscopy). This is done to check for the earliest forms of colorectal cancer.  Routine screening usually begins at age 30.  Direct examination of the colon should be repeated every 5-10 years through 76 years of age. However, you may need to be screened more often if early forms of precancerous polyps or small growths are found.  Skin Cancer  Check your skin from head to toe regularly.  Tell your health care provider about any new moles or changes in moles, especially if there is a change in a  mole's shape or color.  Also tell your health care provider if you have a mole that is larger than the size of a pencil eraser.  Always use sunscreen. Apply sunscreen liberally and repeatedly throughout the day.  Protect yourself by wearing long sleeves, pants, a wide-brimmed hat, and sunglasses whenever you are outside.  Heart disease, diabetes, and high blood pressure  High blood pressure causes heart disease and increases the risk of stroke. High blood pressure is more likely to develop in: ? People who have blood pressure in the high end of the normal range (130-139/85-89 mm Hg). ? People who are overweight or obese. ? People who are African American.  If you are 14-91 years of age, have your blood pressure checked every 3-5 years. If you are 44 years of age or older, have your blood pressure checked every year. You should have your blood pressure measured twice-once when you are at a hospital or clinic, and once when you are not at a hospital or clinic. Record the average of the two measurements. To check  your blood pressure when you are not at a hospital or clinic, you can use: ? An automated blood pressure machine at a pharmacy. ? A home blood pressure monitor.  If you are between 60 years and 29 years old, ask your health care provider if you should take aspirin to prevent strokes.  Have regular diabetes screenings. This involves taking a blood sample to check your fasting blood sugar level. ? If you are at a normal weight and have a low risk for diabetes, have this test once every three years after 76 years of age. ? If you are overweight and have a high risk for diabetes, consider being tested at a younger age or more often. Preventing infection Hepatitis B  If you have a higher risk for hepatitis B, you should be screened for this virus. You are considered at high risk for hepatitis B if: ? You were born in a country where hepatitis B is common. Ask your health care provider which countries are considered high risk. ? Your parents were born in a high-risk country, and you have not been immunized against hepatitis B (hepatitis B vaccine). ? You have HIV or AIDS. ? You use needles to inject street drugs. ? You live with someone who has hepatitis B. ? You have had sex with someone who has hepatitis B. ? You get hemodialysis treatment. ? You take certain medicines for conditions, including cancer, organ transplantation, and autoimmune conditions.  Hepatitis C  Blood testing is recommended for: ? Everyone born from [birth year] through 1964. ? Anyone with known risk factors for hepatitis C.  Sexually transmitted infections (STIs)  You should be screened for sexually transmitted infections (STIs) including gonorrhea and chlamydia if: ? You are sexually active and are younger than 76 years of age. ? You are older than 76 years of age and your health care provider tells you that you are at risk for this type of infection. ? Your sexual activity has changed since you were last screened and you  are at an increased risk for chlamydia or gonorrhea. Ask your health care provider if you are at risk.  If you do not have HIV, but are at risk, it may be recommended that you take a prescription medicine daily to prevent HIV infection. This is called pre-exposure prophylaxis (PrEP). You are considered at risk if: ? You are sexually active and do not regularly use condoms or  know the HIV status of your partner(s). ? You take drugs by injection. ? You are sexually active with a partner who has HIV.  Talk with your health care provider about whether you are at high risk of being infected with HIV. If you choose to begin PrEP, you should first be tested for HIV. You should then be tested every 3 months for as long as you are taking PrEP. Pregnancy  If you are premenopausal and you may become pregnant, ask your health care provider about preconception counseling.  If you may become pregnant, take 400 to 800 micrograms (mcg) of folic acid every day.  If you want to prevent pregnancy, talk to your health care provider about birth control (contraception). Osteoporosis and menopause  Osteoporosis is a disease in which the bones lose minerals and strength with aging. This can result in serious bone fractures. Your risk for osteoporosis can be identified using a bone density scan.  If you are 70 years of age or older, or if you are at risk for osteoporosis and fractures, ask your health care provider if you should be screened.  Ask your health care provider whether you should take a calcium or vitamin D supplement to lower your risk for osteoporosis.  Menopause may have certain physical symptoms and risks.  Hormone replacement therapy may reduce some of these symptoms and risks. Talk to your health care provider about whether hormone replacement therapy is right for you. Follow these instructions at home:  Schedule regular health, dental, and eye exams.  Stay current with your  immunizations.  Do not use any tobacco products including cigarettes, chewing tobacco, or electronic cigarettes.  If you are pregnant, do not drink alcohol.  If you are breastfeeding, limit how much and how often you drink alcohol.  Limit alcohol intake to no more than 1 drink per day for nonpregnant women. One drink equals 12 ounces of beer, 5 ounces of wine, or 1 ounces of hard liquor.  Do not use street drugs.  Do not share needles.  Ask your health care provider for help if you need support or information about quitting drugs.  Tell your health care provider if you often feel depressed.  Tell your health care provider if you have ever been abused or do not feel safe at home. This information is not intended to replace advice given to you by your health care provider. Make sure you discuss any questions you have with your health care provider. Document Released: 09/01/2010 Document Revised: 07/25/2015 Document Reviewed: 11/20/2014 Elsevier Interactive Patient Education  [employer].

## 2017-12-31 ENCOUNTER — Other Ambulatory Visit (INDEPENDENT_AMBULATORY_CARE_PROVIDER_SITE_OTHER): Payer: PPO

## 2017-12-31 DIAGNOSIS — Z5181 Encounter for therapeutic drug level monitoring: Secondary | ICD-10-CM | POA: Diagnosis not present

## 2017-12-31 LAB — BASIC METABOLIC PANEL
BUN: 19 mg/dL (ref 6–23)
CALCIUM: 9.8 mg/dL (ref 8.4–10.5)
CO2: 25 mEq/L (ref 19–32)
CREATININE: 0.87 mg/dL (ref 0.40–1.20)
Chloride: 106 mEq/L (ref 96–112)
GFR: 67.17 mL/min (ref >60.00)
Glucose, Bld: 79 mg/dL (ref 70–99)
Potassium: 5.2 mEq/L — ABNORMAL HIGH (ref 3.5–5.1)
Sodium: 140 mEq/L (ref 135–145)

## 2018-01-01 LAB — URINE CULTURE
MICRO NUMBER: 91311748
SPECIMEN QUALITY: ADEQUATE

## 2018-01-02 MED ORDER — AMOXICILLIN 500 MG PO CAPS: 500.0000 mg | ORAL_CAPSULE | Freq: Two times a day (BID) | ORAL | 0 refills | Status: DC

## 2018-01-02 NOTE — Addendum Note (Signed)
Addended by: Abbe Amsterdam C on: 01/02/2018 01:42 PM   Modules accepted: Orders

## 2018-01-03 ENCOUNTER — Encounter: Payer: Self-pay | Admitting: Family Medicine

## 2018-01-06 DIAGNOSIS — M25431 Effusion, right wrist: Secondary | ICD-10-CM | POA: Diagnosis not present

## 2018-01-06 DIAGNOSIS — S62101D Fracture of unspecified carpal bone, right wrist, subsequent encounter for fracture with routine healing: Secondary | ICD-10-CM | POA: Diagnosis not present

## 2018-01-06 DIAGNOSIS — M25631 Stiffness of right wrist, not elsewhere classified: Secondary | ICD-10-CM | POA: Diagnosis not present

## 2018-01-06 DIAGNOSIS — M25531 Pain in right wrist: Secondary | ICD-10-CM | POA: Diagnosis not present

## 2018-01-10 ENCOUNTER — Telehealth: Payer: Self-pay | Admitting: *Deleted

## 2018-01-10 DIAGNOSIS — M25631 Stiffness of right wrist, not elsewhere classified: Secondary | ICD-10-CM | POA: Diagnosis not present

## 2018-01-10 DIAGNOSIS — M25531 Pain in right wrist: Secondary | ICD-10-CM | POA: Diagnosis not present

## 2018-01-10 DIAGNOSIS — S62101D Fracture of unspecified carpal bone, right wrist, subsequent encounter for fracture with routine healing: Secondary | ICD-10-CM | POA: Diagnosis not present

## 2018-01-10 DIAGNOSIS — M25431 Effusion, right wrist: Secondary | ICD-10-CM | POA: Diagnosis not present

## 2018-01-10 NOTE — Telephone Encounter (Signed)
Received Physician Orders/Plan of Care from [facility]; forwarded to provider/SLS 11/11

## 2018-01-12 DIAGNOSIS — S62101D Fracture of unspecified carpal bone, right wrist, subsequent encounter for fracture with routine healing: Secondary | ICD-10-CM | POA: Diagnosis not present

## 2018-01-12 DIAGNOSIS — M25631 Stiffness of right wrist, not elsewhere classified: Secondary | ICD-10-CM | POA: Diagnosis not present

## 2018-01-12 DIAGNOSIS — M25531 Pain in right wrist: Secondary | ICD-10-CM | POA: Diagnosis not present

## 2018-01-12 DIAGNOSIS — M25431 Effusion, right wrist: Secondary | ICD-10-CM | POA: Diagnosis not present

## 2018-01-14 DIAGNOSIS — S62101D Fracture of unspecified carpal bone, right wrist, subsequent encounter for fracture with routine healing: Secondary | ICD-10-CM | POA: Diagnosis not present

## 2018-01-14 DIAGNOSIS — M25631 Stiffness of right wrist, not elsewhere classified: Secondary | ICD-10-CM | POA: Diagnosis not present

## 2018-01-14 DIAGNOSIS — M25531 Pain in right wrist: Secondary | ICD-10-CM | POA: Diagnosis not present

## 2018-01-14 DIAGNOSIS — M25431 Effusion, right wrist: Secondary | ICD-10-CM | POA: Diagnosis not present

## 2018-01-17 DIAGNOSIS — M25431 Effusion, right wrist: Secondary | ICD-10-CM | POA: Diagnosis not present

## 2018-01-17 DIAGNOSIS — S32519D Fracture of superior rim of unspecified pubis, subsequent encounter for fracture with routine healing: Secondary | ICD-10-CM | POA: Diagnosis not present

## 2018-01-17 DIAGNOSIS — Z4789 Encounter for other orthopedic aftercare: Secondary | ICD-10-CM | POA: Diagnosis not present

## 2018-01-17 DIAGNOSIS — M25631 Stiffness of right wrist, not elsewhere classified: Secondary | ICD-10-CM | POA: Diagnosis not present

## 2018-01-17 DIAGNOSIS — S32599D Other specified fracture of unspecified pubis, subsequent encounter for fracture with routine healing: Secondary | ICD-10-CM | POA: Diagnosis not present

## 2018-01-17 DIAGNOSIS — S52571D Other intraarticular fracture of lower end of right radius, subsequent encounter for closed fracture with routine healing: Secondary | ICD-10-CM | POA: Diagnosis not present

## 2018-01-17 DIAGNOSIS — S62101D Fracture of unspecified carpal bone, right wrist, subsequent encounter for fracture with routine healing: Secondary | ICD-10-CM | POA: Diagnosis not present

## 2018-01-17 DIAGNOSIS — M25531 Pain in right wrist: Secondary | ICD-10-CM | POA: Diagnosis not present

## 2018-01-19 ENCOUNTER — Ambulatory Visit
Admission: RE | Admit: 2018-01-19 | Discharge: 2018-01-19 | Disposition: A | Discharge status: Home / Self Care | Payer: PPO | Source: Ambulatory Visit | Attending: Family Medicine | Admitting: Family Medicine

## 2018-01-19 DIAGNOSIS — M25531 Pain in right wrist: Secondary | ICD-10-CM | POA: Diagnosis not present

## 2018-01-19 DIAGNOSIS — M25431 Effusion, right wrist: Secondary | ICD-10-CM | POA: Diagnosis not present

## 2018-01-19 DIAGNOSIS — S0990XA Unspecified injury of head, initial encounter: Secondary | ICD-10-CM | POA: Diagnosis not present

## 2018-01-19 DIAGNOSIS — G44329 Chronic post-traumatic headache, not intractable: Secondary | ICD-10-CM

## 2018-01-19 DIAGNOSIS — S62101D Fracture of unspecified carpal bone, right wrist, subsequent encounter for fracture with routine healing: Secondary | ICD-10-CM | POA: Diagnosis not present

## 2018-01-19 DIAGNOSIS — M25631 Stiffness of right wrist, not elsewhere classified: Secondary | ICD-10-CM | POA: Diagnosis not present

## 2018-01-19 DIAGNOSIS — R42 Dizziness and giddiness: Secondary | ICD-10-CM | POA: Diagnosis not present

## 2018-01-19 MED ORDER — GADOBENATE DIMEGLUMINE 529 MG/ML IV SOLN
11.0000 mL | Freq: Once | INTRAVENOUS | Status: AC | PRN
  Administered 2018-01-19: 11 mL via INTRAVENOUS

## 2018-01-21 ENCOUNTER — Encounter: Payer: Self-pay | Admitting: Family Medicine

## 2018-01-21 DIAGNOSIS — M25631 Stiffness of right wrist, not elsewhere classified: Secondary | ICD-10-CM | POA: Diagnosis not present

## 2018-01-21 DIAGNOSIS — S62101D Fracture of unspecified carpal bone, right wrist, subsequent encounter for fracture with routine healing: Secondary | ICD-10-CM | POA: Diagnosis not present

## 2018-01-21 DIAGNOSIS — M25531 Pain in right wrist: Secondary | ICD-10-CM | POA: Diagnosis not present

## 2018-01-21 DIAGNOSIS — M25431 Effusion, right wrist: Secondary | ICD-10-CM | POA: Diagnosis not present

## 2018-01-25 DIAGNOSIS — S62101D Fracture of unspecified carpal bone, right wrist, subsequent encounter for fracture with routine healing: Secondary | ICD-10-CM | POA: Diagnosis not present

## 2018-01-25 DIAGNOSIS — M25631 Stiffness of right wrist, not elsewhere classified: Secondary | ICD-10-CM | POA: Diagnosis not present

## 2018-01-25 DIAGNOSIS — M25431 Effusion, right wrist: Secondary | ICD-10-CM | POA: Diagnosis not present

## 2018-01-25 DIAGNOSIS — M25531 Pain in right wrist: Secondary | ICD-10-CM | POA: Diagnosis not present

## 2018-01-26 NOTE — Progress Notes (Signed)
Subjective:   Caroline Sandoval is a 77 y.o. female who presents for Medicare Annual (Subsequent) preventive examination.  Pt's husband recently diagnosed with Parkinson's. Review of Systems: Physical assessment deferred to PCP. Cardiac Risk Factors include: advanced age (>6555men, 64>65 women) Sleep patterns: very restless sleep. Pt states she will discuss with PCP at next visit. Home Safety/Smoke Alarms: Feels safe in home. Smoke alarms in place. Lives with husband.  Eye- UTD per pt. Dr.Atkins annually. Female:      Mammo-  [facility]    09/30/17 Dexa scan- utd       CCS-utd     Objective:     Vitals: BP 138/80 (BP Location: Left Arm, Patient Position: Sitting, Cuff Size: Normal)   Pulse 62   Ht 5\' 1"  (1.549 m)   Wt 130 lb 12.8 oz (59.3 kg)   SpO2 98%   BMI 24.71 kg/m   Body mass index is 24.71 kg/m.  Advanced Directives 01/31/2018 11/18/2017 01/27/2017  Does Patient Have a Medical Advance Directive? No No No  Would patient like information on creating a medical advance directive? No - Patient declined No - Patient declined Yes (MAU/Ambulatory/Procedural Areas - Information given)    Tobacco Social History   Tobacco Use  Smoking Status Never Smoker  Smokeless Tobacco Never Used     Counseling given: Not Answered   Clinical Intake:     Pain : No/denies pain                 Past Medical History:  Diagnosis Date  . Fall at home    pt fell at home 10/20/17, [facility]  She suffered a right radial/ ulnar fracture and pelvic fractures  . Headache   . Shingles    Past Surgical History:  Procedure Laterality Date  . ABDOMINAL HYSTERECTOMY    . CHOLECYSTECTOMY    . ORIF RADIUS & ULNA FRACTURES Right [facility]2017   [facility]   . TOE SURGERY Bilateral    in-grown toe nails  . WRIST FRACTURE SURGERY  10/21/2017   Family History  Problem Relation Age of Onset  . Pulmonary  fibrosis Mother   . Idiopathic pulmonary fibrosis Mother   . Bladder Cancer Father   . Cancer Father        Bladder cancer  . Heart attack Brother   . Pulmonary fibrosis Brother   . Rheum arthritis Brother   . Idiopathic pulmonary fibrosis Brother   . Arthritis/Rheumatoid Brother   . Pulmonary fibrosis Brother   . Hypertension Brother   . Arthritis Brother   . Neuropathy Neg Hx   . Stroke Neg Hx   . Ataxia Neg Hx    Social History   Socioeconomic History  . Marital status: Married    Spouse name: Not on file  . Number of children: 2  . Years of education: College  . Highest education level: Not on file  Occupational History  . Occupation: Retired   Engineer, productionocial Needs  . Financial resource strain: Not on file  . Food insecurity:    Worry: Not on file    Inability: Not on file  . Transportation needs:    Medical: Not on file    Non-medical: Not on file  Tobacco Use  . Smoking status: Never Smoker  . Smokeless tobacco: Never Used  Substance and Sexual Activity  . Alcohol use: No    Alcohol/week: 0.0  standard drinks  . Drug use: No  . Sexual activity: Never  Lifestyle  . Physical activity:    Days per week: Not on file    Minutes per session: Not on file  . Stress: Not on file  Relationships  . Social connections:    Talks on phone: Not on file    Gets together: Not on file    Attends religious service: Not on file    Active member of club or organization: Not on file    Attends meetings of clubs or organizations: Not on file    Relationship status: Not on file  Other Topics Concern  . Not on file  Social History Narrative   Drinks about 1 cup of coffee a day, may drink a soda during the day     Outpatient Encounter Medications as of 01/31/2018  Medication Sig  . FLUoxetine (PROZAC) 20 MG tablet Take 1 tablet (20 mg total) by mouth daily.  Marland Kitchen gabapentin (NEURONTIN) 300 MG capsule Take 1 capsule (300 mg total) by mouth 2 (two) times daily.  Marland Kitchen ibandronate (BONIVA)  150 MG tablet Take 1 tablet (150 mg total) by mouth every 30 (thirty) days. Take in the morning with a full glass of water, on an empty stomach. Don't lie down for 30 min  . ibuprofen (ADVIL,MOTRIN) 600 MG tablet Take 600 mg by mouth.  . pantoprazole (PROTONIX) 40 MG tablet Take 40 mg by mouth.  . [DISCONTINUED] amoxicillin (AMOXIL) 500 MG capsule Take 1 capsule (500 mg total) by mouth 2 (two) times daily.   No facility-administered encounter medications on file as of 01/31/2018.     Activities of Daily Living In your present state of health, do you have any difficulty performing the following activities: 01/31/2018  Hearing? N  Vision? N  Difficulty concentrating or making decisions? N  Comment likes to read bible and fiction and history. reads daily.   Walking or climbing stairs? N  Dressing or bathing? N  Doing errands, shopping? N  Preparing Food and eating ? N  Using the Toilet? N  In the past six months, have you accidently leaked urine? N  Do you have problems with loss of bowel control? N  Managing your Medications? N  Managing your Finances? N  Housekeeping or managing your Housekeeping? N  Some recent data might be hidden    Patient Care Team: Copland, Gwenlyn Found, MD as PCP - General (Family Medicine) Sydnee Cabal., MD as Consulting Physician (Gastroenterology) Verlin Dike., MD as Referring Physician (Sports Medicine)    Assessment:   This is a routine wellness examination for Hubbard. Physical assessment deferred to PCP.  Exercise Activities and Dietary recommendations Current Exercise Habits: Home exercise routine, Type of exercise: walking(pt has two dogs that she walks), Time (Minutes): 40, Frequency (Times/Week): >7, Weekly Exercise (Minutes/Week): 0, Intensity: Mild, Exercise limited by: None identified   Diet (meal preparation, eat out, water intake, caffeinated beverages, dairy products, fruits and vegetables): in general, a "healthy" diet  , well  balanced    Goals    . Maintain healthy eating and normal BMI (pt-stated)       Fall Risk Fall Risk  01/31/2018 01/27/2017 05/29/2015  Falls in the past year? 1 Yes Yes  Number falls in past yr: 0 1 1  Injury with Fall? 1 Yes Yes  Follow up - Education provided;Falls prevention discussed Falls prevention discussed    Depression Screen PHQ 2/9 Scores 01/31/2018 11/18/2017 01/27/2017  PHQ -  2 Score 6 0 0  PHQ- 9 Score 24 - -     Cognitive Function Ad8 score reviewed for issues:  Issues making decisions:no  Less interest in hobbies / activities:no  Repeats questions, stories (family complaining):no  Trouble using ordinary gadgets (microwave, computer, phone):no  Forgets the month or year: no  Mismanaging finances: no  Remembering appts:no  Daily problems with thinking and/or memory:no Ad8 score is=0     MMSE - Mini Mental State Exam 01/27/2017  Orientation to time 5  Orientation to Place 5  Registration 3  Attention/ Calculation 5  Recall 3  Language- name 2 objects 2  Language- repeat 1  Language- follow 3 step command 3  Language- read & follow direction 1  Write a sentence 1  Copy design 1  Total score 30        Immunization History  Administered Date(s) Administered  . Influenza, High Dose Seasonal PF 12/16/2012, 12/24/2016, 11/22/2017  . Influenza-Unspecified 12/01/2015  . Pneumococcal Conjugate-13 08/19/2016  . Pneumococcal Polysaccharide-23 03/03/2011  . Tdap 11/05/2011    Screening Tests Health Maintenance  Topic Date Due  . TETANUS/TDAP  11/04/2021  . INFLUENZA VACCINE  Completed  . DEXA SCAN  Completed  . PNA vac Low Risk Adult  Completed       Plan:    Please schedule your next medicare wellness visit with me in 1 yr.  Continue to eat heart healthy diet (full of fruits, vegetables, whole grains, lean protein, water--limit salt, fat, and sugar intake) and increase physical activity as tolerated.    I have personally reviewed  and noted the following in the patient's chart:   . Medical and social history . Use of alcohol, tobacco or illicit drugs  . Current medications and supplements . Functional ability and status . Nutritional status . Physical activity . Advanced directives . List of other physicians . Hospitalizations, surgeries, and ER visits in previous 12 months . Vitals . Screenings to include cognitive, depression, and falls . Referrals and appointments  In addition, I have reviewed and discussed with patient certain preventive protocols, quality metrics, and best practice recommendations. A written personalized care plan for preventive services as well as general preventive health recommendations were provided to patient.     [place]on Gully, [place]  01/31/2018

## 2018-01-31 ENCOUNTER — Encounter: Payer: Self-pay | Admitting: *Deleted

## 2018-01-31 ENCOUNTER — Ambulatory Visit (INDEPENDENT_AMBULATORY_CARE_PROVIDER_SITE_OTHER): Payer: PPO | Admitting: *Deleted

## 2018-01-31 ENCOUNTER — Telehealth: Payer: Self-pay | Admitting: Family Medicine

## 2018-01-31 VITALS — BP 138/80 | HR 62 | Ht 61.0 in | Wt 130.8 lb

## 2018-01-31 DIAGNOSIS — R42 Dizziness and giddiness: Secondary | ICD-10-CM

## 2018-01-31 DIAGNOSIS — M25531 Pain in right wrist: Secondary | ICD-10-CM | POA: Diagnosis not present

## 2018-01-31 DIAGNOSIS — M25431 Effusion, right wrist: Secondary | ICD-10-CM | POA: Diagnosis not present

## 2018-01-31 DIAGNOSIS — Z0000 Encounter for general adult medical examination without abnormal findings: Secondary | ICD-10-CM

## 2018-01-31 DIAGNOSIS — S62101D Fracture of unspecified carpal bone, right wrist, subsequent encounter for fracture with routine healing: Secondary | ICD-10-CM | POA: Diagnosis not present

## 2018-01-31 DIAGNOSIS — M25631 Stiffness of right wrist, not elsewhere classified: Secondary | ICD-10-CM | POA: Diagnosis not present

## 2018-01-31 MED ORDER — MECLIZINE HCL 12.5 MG PO TABS: 12.5000 mg | ORAL_TABLET | Freq: Three times a day (TID) | ORAL | 0 refills | Status: DC | PRN

## 2018-01-31 NOTE — Patient Instructions (Signed)
Please schedule your next medicare wellness visit with me in 1 yr.  Continue to eat heart healthy diet (full of fruits, vegetables, whole grains, lean protein, water--limit salt, fat, and sugar intake) and increase physical activity as tolerated.   Caroline Sandoval , Thank you for taking time to come for your Medicare Wellness Visit. I appreciate your ongoing commitment to your health goals. Please review the following plan we discussed and let me know if I can assist you in the future.   These are the goals we discussed: Goals    . Maintain healthy eating and normal BMI (pt-stated)       This is a list of the screening recommended for you and due dates:  Health Maintenance  Topic Date Due  . Tetanus Vaccine  11/04/2021  . Flu Shot  Completed  . DEXA scan (bone density measurement)  Completed  . Pneumonia vaccines  Completed    Health Maintenance for Postmenopausal Women Menopause is a normal process in which your reproductive ability comes to an end. This process happens gradually over a span of months to years, usually between the ages of 3 and 37. Menopause is complete when you have missed 12 consecutive menstrual periods. It is important to talk with your health care provider about some of the most common conditions that affect postmenopausal women, such as heart disease, cancer, and bone loss (osteoporosis). Adopting a healthy lifestyle and getting preventive care can help to promote your health and wellness. Those actions can also lower your chances of developing some of these common conditions. What should I know about menopause? During menopause, you may experience a number of symptoms, such as:  Moderate-to-severe hot flashes.  Night sweats.  Decrease in sex drive.  Mood swings.  Headaches.  Tiredness.  Irritability.  Memory problems.  Insomnia.  Choosing to treat or not to treat menopausal changes is an individual decision that you make with your health care  provider. What should I know about hormone replacement therapy and supplements? Hormone therapy products are effective for treating symptoms that are associated with menopause, such as hot flashes and night sweats. Hormone replacement carries certain risks, especially as you become older. If you are thinking about using estrogen or estrogen with progestin treatments, discuss the benefits and risks with your health care provider. What should I know about heart disease and stroke? Heart disease, heart attack, and stroke become more likely as you age. This may be due, in part, to the hormonal changes that your body experiences during menopause. These can affect how your body processes dietary fats, triglycerides, and cholesterol. Heart attack and stroke are both medical emergencies. There are many things that you can do to help prevent heart disease and stroke:  Have your blood pressure checked at least every 1-2 years. High blood pressure causes heart disease and increases the risk of stroke.  If you are 105-7 years old, ask your health care provider if you should take aspirin to prevent a heart attack or a stroke.  Do not use any tobacco products, including cigarettes, chewing tobacco, or electronic cigarettes. If you need help quitting, ask your health care provider.  It is important to eat a healthy diet and maintain a healthy weight. ? Be sure to include plenty of vegetables, fruits, low-fat dairy products, and lean protein. ? Avoid eating foods that are high in solid fats, added sugars, or salt (sodium).  Get regular exercise. This is one of the most important things that you can  do for your health. ? Try to exercise for at least 150 minutes each week. The type of exercise that you do should increase your heart rate and make you sweat. This is known as moderate-intensity exercise. ? Try to do strengthening exercises at least twice each week. Do these in addition to the moderate-intensity  exercise.  Know your numbers.Ask your health care provider to check your cholesterol and your blood glucose. Continue to have your blood tested as directed by your health care provider.  What should I know about cancer screening? There are several types of cancer. Take the following steps to reduce your risk and to catch any cancer development as early as possible. Breast Cancer  Practice breast self-awareness. ? This means understanding how your breasts normally appear and feel. ? It also means doing regular breast self-exams. Let your health care provider know about any changes, no matter how small.  If you are 57 or older, have a clinician do a breast exam (clinical breast exam or CBE) every year. Depending on your age, family history, and medical history, it may be recommended that you also have a yearly breast X-ray (mammogram).  If you have a family history of breast cancer, talk with your health care provider about genetic screening.  If you are at high risk for breast cancer, talk with your health care provider about having an MRI and a mammogram every year.  Breast cancer (BRCA) gene test is recommended for women who have family members with BRCA-related cancers. Results of the assessment will determine the need for genetic counseling and BRCA1 and for BRCA2 testing. BRCA-related cancers include these types: ? Breast. This occurs in males or females. ? Ovarian. ? Tubal. This may also be called fallopian tube cancer. ? Cancer of the abdominal or pelvic lining (peritoneal cancer). ? Prostate. ? Pancreatic.  Cervical, Uterine, and Ovarian Cancer Your health care provider may recommend that you be screened regularly for cancer of the pelvic organs. These include your ovaries, uterus, and vagina. This screening involves a pelvic exam, which includes checking for microscopic changes to the surface of your cervix (Pap test).  For women ages 21-65, health care providers may recommend a  pelvic exam and a Pap test every three years. For women ages 63-65, they may recommend the Pap test and pelvic exam, combined with testing for human papilloma virus (HPV), every five years. Some types of HPV increase your risk of cervical cancer. Testing for HPV may also be done on women of any age who have unclear Pap test results.  Other health care providers may not recommend any screening for nonpregnant women who are considered low risk for pelvic cancer and have no symptoms. Ask your health care provider if a screening pelvic exam is right for you.  If you have had past treatment for cervical cancer or a condition that could lead to cancer, you need Pap tests and screening for cancer for at least 20 years after your treatment. If Pap tests have been discontinued for you, your risk factors (such as having a new sexual partner) need to be reassessed to determine if you should start having screenings again. Some women have medical problems that increase the chance of getting cervical cancer. In these cases, your health care provider may recommend that you have screening and Pap tests more often.  If you have a family history of uterine cancer or ovarian cancer, talk with your health care provider about genetic screening.  If you have  vaginal bleeding after reaching menopause, tell your health care provider.  There are currently no reliable tests available to screen for ovarian cancer.  Lung Cancer Lung cancer screening is recommended for adults 72-74 years old who are at high risk for lung cancer because of a history of smoking. A yearly low-dose CT scan of the lungs is recommended if you:  Currently smoke.  Have a history of at least 30 pack-years of smoking and you currently smoke or have quit within the past 15 years. A pack-year is smoking an average of one pack of cigarettes per day for one year.  Yearly screening should:  Continue until it has been 15 years since you quit.  Stop if  you develop a health problem that would prevent you from having lung cancer treatment.  Colorectal Cancer  This type of cancer can be detected and can often be prevented.  Routine colorectal cancer screening usually begins at age 2 and continues through age 65.  If you have risk factors for colon cancer, your health care provider may recommend that you be screened at an earlier age.  If you have a family history of colorectal cancer, talk with your health care provider about genetic screening.  Your health care provider may also recommend using home test kits to check for hidden blood in your stool.  A small camera at the end of a tube can be used to examine your colon directly (sigmoidoscopy or colonoscopy). This is done to check for the earliest forms of colorectal cancer.  Direct examination of the colon should be repeated every 5-10 years until age 31. However, if early forms of precancerous polyps or small growths are found or if you have a family history or genetic risk for colorectal cancer, you may need to be screened more often.  Skin Cancer  Check your skin from head to toe regularly.  Monitor any moles. Be sure to tell your health care provider: ? About any new moles or changes in moles, especially if there is a change in a mole's shape or color. ? If you have a mole that is larger than the size of a pencil eraser.  If any of your family members has a history of skin cancer, especially at a young age, talk with your health care provider about genetic screening.  Always use sunscreen. Apply sunscreen liberally and repeatedly throughout the day.  Whenever you are outside, protect yourself by wearing long sleeves, pants, a wide-brimmed hat, and sunglasses.  What should I know about osteoporosis? Osteoporosis is a condition in which bone destruction happens more quickly than new bone creation. After menopause, you may be at an increased risk for osteoporosis. To help prevent  osteoporosis or the bone fractures that can happen because of osteoporosis, the following is recommended:  If you are 31-44 years old, get at least 1,000 mg of calcium and at least 600 mg of vitamin D per day.  If you are older than age 45 but younger than age 39, get at least 1,200 mg of calcium and at least 600 mg of vitamin D per day.  If you are older than age 18, get at least 1,200 mg of calcium and at least 800 mg of vitamin D per day.  Smoking and excessive alcohol intake increase the risk of osteoporosis. Eat foods that are rich in calcium and vitamin D, and do weight-bearing exercises several times each week as directed by your health care provider. What should I know about  how menopause affects my mental health? Depression may occur at any age, but it is more common as you become older. Common symptoms of depression include:  Low or sad mood.  Changes in sleep patterns.  Changes in appetite or eating patterns.  Feeling an overall lack of motivation or enjoyment of activities that you previously enjoyed.  Frequent crying spells.  Talk with your health care provider if you think that you are experiencing depression. What should I know about immunizations? It is important that you get and maintain your immunizations. These include:  Tetanus, diphtheria, and pertussis (Tdap) booster vaccine.  Influenza every year before the flu season begins.  Pneumonia vaccine.  Shingles vaccine.  Your health care provider may also recommend other immunizations. This information is not intended to replace advice given to you by your health care provider. Make sure you discuss any questions you have with your health care provider. Document Released: 04/10/2005 Document Revised: 09/06/2015 Document Reviewed: 11/20/2014 Elsevier Interactive Patient Education  2018 [employer].

## 2018-01-31 NOTE — Telephone Encounter (Signed)
Called her back- she continues to have what sounds like vertigo. This has been an issue since her recent fall,but she also had vertigo years ago to the point where she might have to stay in bedfor days at at time She is not lightheaded Will rx meclizine for her to use prn Cautioned regarding sedation She will let me know if not helpful Offered to refer her to neurology but she prefers to see how this does for her first

## 2018-01-31 NOTE — Progress Notes (Signed)
I have reviewed the above MWE note by Ms. Britt and agree with her documentation J Copland MD  

## 2018-01-31 NOTE — Telephone Encounter (Signed)
-----   Message from Glenna DurandVictoria A Britt, RN sent at 01/31/2018 10:42 AM EST ----- Pt here today for AWV. Reports the dizziness she discussed with you at CPE 12/30/17 is still an issue. States she has hx of vertigo. Also inquiring about med to help her sleep.  Thanks,  CIGNAngel

## 2018-02-02 DIAGNOSIS — M25431 Effusion, right wrist: Secondary | ICD-10-CM | POA: Diagnosis not present

## 2018-02-02 DIAGNOSIS — M25631 Stiffness of right wrist, not elsewhere classified: Secondary | ICD-10-CM | POA: Diagnosis not present

## 2018-02-02 DIAGNOSIS — S62101D Fracture of unspecified carpal bone, right wrist, subsequent encounter for fracture with routine healing: Secondary | ICD-10-CM | POA: Diagnosis not present

## 2018-02-02 DIAGNOSIS — M25531 Pain in right wrist: Secondary | ICD-10-CM | POA: Diagnosis not present

## 2018-02-08 DIAGNOSIS — M25431 Effusion, right wrist: Secondary | ICD-10-CM | POA: Diagnosis not present

## 2018-02-08 DIAGNOSIS — M25631 Stiffness of right wrist, not elsewhere classified: Secondary | ICD-10-CM | POA: Diagnosis not present

## 2018-02-08 DIAGNOSIS — M25531 Pain in right wrist: Secondary | ICD-10-CM | POA: Diagnosis not present

## 2018-02-08 DIAGNOSIS — S62101D Fracture of unspecified carpal bone, right wrist, subsequent encounter for fracture with routine healing: Secondary | ICD-10-CM | POA: Diagnosis not present

## 2018-02-10 DIAGNOSIS — H25813 Combined forms of age-related cataract, bilateral: Secondary | ICD-10-CM | POA: Diagnosis not present

## 2018-02-10 DIAGNOSIS — M25631 Stiffness of right wrist, not elsewhere classified: Secondary | ICD-10-CM | POA: Diagnosis not present

## 2018-02-10 DIAGNOSIS — M25431 Effusion, right wrist: Secondary | ICD-10-CM | POA: Diagnosis not present

## 2018-02-10 DIAGNOSIS — S62101D Fracture of unspecified carpal bone, right wrist, subsequent encounter for fracture with routine healing: Secondary | ICD-10-CM | POA: Diagnosis not present

## 2018-02-10 DIAGNOSIS — H353111 Nonexudative age-related macular degeneration, right eye, early dry stage: Secondary | ICD-10-CM | POA: Diagnosis not present

## 2018-02-10 DIAGNOSIS — M25531 Pain in right wrist: Secondary | ICD-10-CM | POA: Diagnosis not present

## 2018-02-24 DIAGNOSIS — M25431 Effusion, right wrist: Secondary | ICD-10-CM | POA: Diagnosis not present

## 2018-02-24 DIAGNOSIS — M25531 Pain in right wrist: Secondary | ICD-10-CM | POA: Diagnosis not present

## 2018-02-24 DIAGNOSIS — S62101D Fracture of unspecified carpal bone, right wrist, subsequent encounter for fracture with routine healing: Secondary | ICD-10-CM | POA: Diagnosis not present

## 2018-02-24 DIAGNOSIS — M25631 Stiffness of right wrist, not elsewhere classified: Secondary | ICD-10-CM | POA: Diagnosis not present

## 2018-02-25 DIAGNOSIS — M25531 Pain in right wrist: Secondary | ICD-10-CM | POA: Diagnosis not present

## 2018-02-25 DIAGNOSIS — M25631 Stiffness of right wrist, not elsewhere classified: Secondary | ICD-10-CM | POA: Diagnosis not present

## 2018-02-25 DIAGNOSIS — S62101D Fracture of unspecified carpal bone, right wrist, subsequent encounter for fracture with routine healing: Secondary | ICD-10-CM | POA: Diagnosis not present

## 2018-02-25 DIAGNOSIS — M25431 Effusion, right wrist: Secondary | ICD-10-CM | POA: Diagnosis not present

## 2018-02-28 DIAGNOSIS — M25631 Stiffness of right wrist, not elsewhere classified: Secondary | ICD-10-CM | POA: Diagnosis not present

## 2018-02-28 DIAGNOSIS — M25531 Pain in right wrist: Secondary | ICD-10-CM | POA: Diagnosis not present

## 2018-02-28 DIAGNOSIS — M25431 Effusion, right wrist: Secondary | ICD-10-CM | POA: Diagnosis not present

## 2018-02-28 DIAGNOSIS — S62101D Fracture of unspecified carpal bone, right wrist, subsequent encounter for fracture with routine healing: Secondary | ICD-10-CM | POA: Diagnosis not present

## 2018-03-07 ENCOUNTER — Telehealth: Payer: Self-pay | Admitting: *Deleted

## 2018-03-07 NOTE — Telephone Encounter (Signed)
Received End of Care Plan for review from [facility]; forwarded to provider/SLS 01/06

## 2018-04-04 ENCOUNTER — Telehealth: Payer: Self-pay | Admitting: *Deleted

## 2018-04-04 NOTE — Telephone Encounter (Signed)
Received request for Medical Records from [facility] and [facility]; forwarded to Medical Records via email/scan/SLS 02/03

## 2018-06-07 ENCOUNTER — Other Ambulatory Visit: Payer: Self-pay

## 2018-06-07 DIAGNOSIS — F329 Major depressive disorder, single episode, unspecified: Secondary | ICD-10-CM

## 2018-06-07 DIAGNOSIS — R42 Dizziness and giddiness: Secondary | ICD-10-CM

## 2018-06-07 MED ORDER — MECLIZINE HCL 12.5 MG PO TABS: 12.5000 mg | ORAL_TABLET | Freq: Three times a day (TID) | ORAL | 3 refills | Status: DC | PRN

## 2018-06-07 MED ORDER — FLUOXETINE HCL 20 MG PO TABS: 20.0000 mg | ORAL_TABLET | Freq: Every day | ORAL | 1 refills | Status: AC

## 2018-07-22 ENCOUNTER — Other Ambulatory Visit: Payer: Self-pay | Admitting: Family Medicine

## 2018-07-22 DIAGNOSIS — R42 Dizziness and giddiness: Secondary | ICD-10-CM

## 2019-02-02 ENCOUNTER — Ambulatory Visit: Payer: PPO | Admitting: *Deleted

## 2019-02-16 ENCOUNTER — Encounter: Payer: Self-pay | Admitting: Family Medicine

## 2019-02-16 ENCOUNTER — Other Ambulatory Visit: Payer: Self-pay | Admitting: Family Medicine

## 2019-02-16 DIAGNOSIS — R42 Dizziness and giddiness: Secondary | ICD-10-CM

## 2019-02-16 NOTE — Telephone Encounter (Signed)
Forwarding to PCP.

## 2019-02-16 NOTE — Telephone Encounter (Signed)
Last OV 01/31/18 Last refill 07/28/18 #30/1 Next OV not scheduled

## 2020-08-09 IMAGING — MR MR HEAD WO/W CM
12 series · 48 of 48 positions shown · IV contrast (11ml multihance)
Comparison: CT 03/09/2015

CLINICAL DATA: Fell from a ladder with head trauma in [REDACTED] of
this year. Dizzy spells since then

EXAM:
MRI HEAD WITHOUT AND WITH CONTRAST
TECHNIQUE: Multiplanar, multiecho pulse sequences of the brain and surrounding
structures were obtained without and with intravenous contrast.
CONTRAST:  11mL MULTIHANCE GADOBENATE DIMEGLUMINE 529 MG/ML IV SOLN

[Series 2: T1 · sagittal · 5.0mm · 0.45mm/px · 3 of 26 slices shown]
[im 1/26]
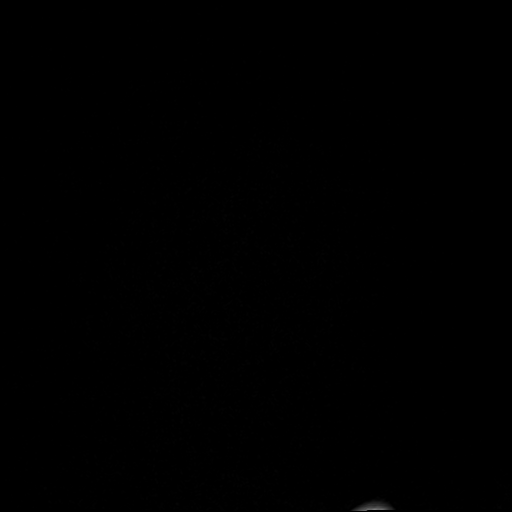
[im 13/26]
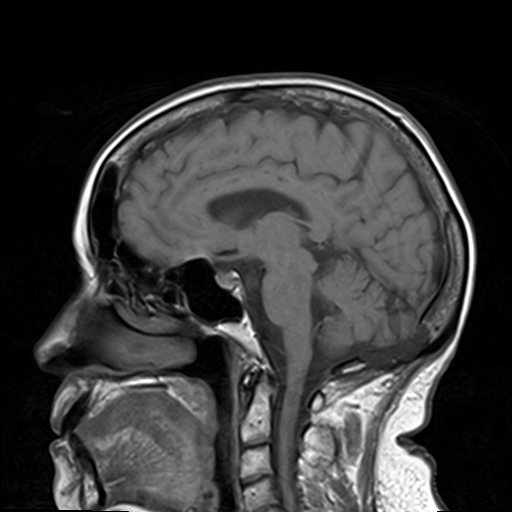
[im 26/26]
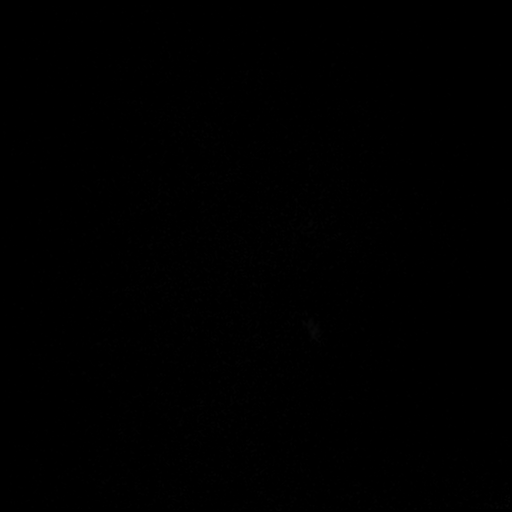

[Series 3: DWI · axial · 3.0mm · 1.80mm/px · z∈[-31,+116]mm · 6 of 100 slices shown (1 of 4)]
[im 1/100]
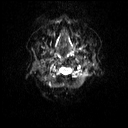
[im 20/100]
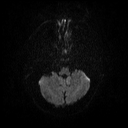
[im 40/100]
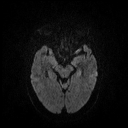
[im 60/100]
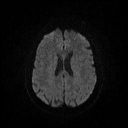
[im 80/100]
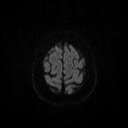
[im 100/100]
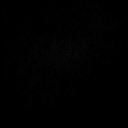

[Series 4: DWI · axial · 3.0mm · 1.80mm/px · z∈[-40,+116]mm · 3 of 52 slices shown (2 of 4)]
[im 1/52]
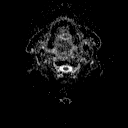
[im 26/52]
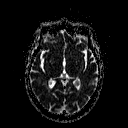
[im 52/52]
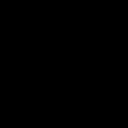

[Series 5: DWI · coronal · 5.0mm · 1.80mm/px · 5 of 80 slices shown (3 of 4)]
[im 1/80]
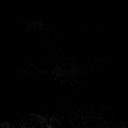
[im 20/80]
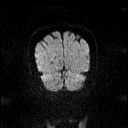
[im 40/80]
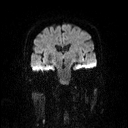
[im 60/80]
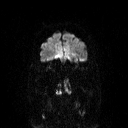
[im 80/80]
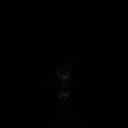

[Series 6: DWI · coronal · 5.0mm · 1.80mm/px · 3 of 39 slices shown (4 of 4)]
[im 1/39]
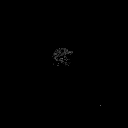
[im 20/39]
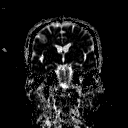
[im 39/39]
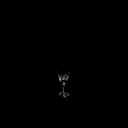

[Series 7: T2 · axial · 5.0mm · 0.51mm/px · z∈[-48,+114]mm · 2 of 25 slices shown (1 of 2)]
[im 1/25]
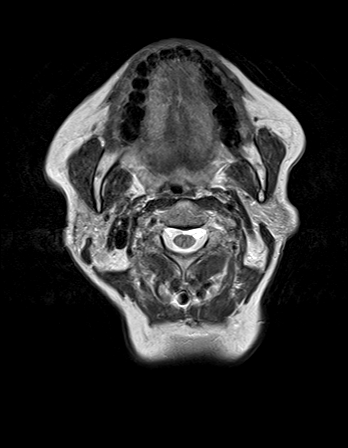
[im 25/25]
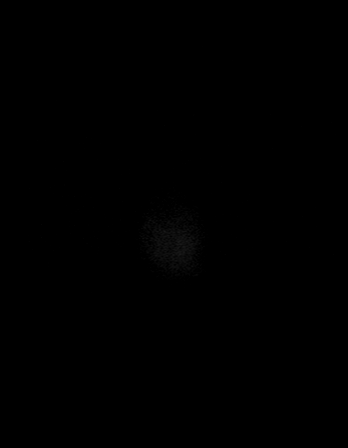

[Series 8: FLAIR · axial · 3.0mm · 0.45mm/px · z∈[-42,+116]mm · 2 of 35 slices shown]
[im 1/35]
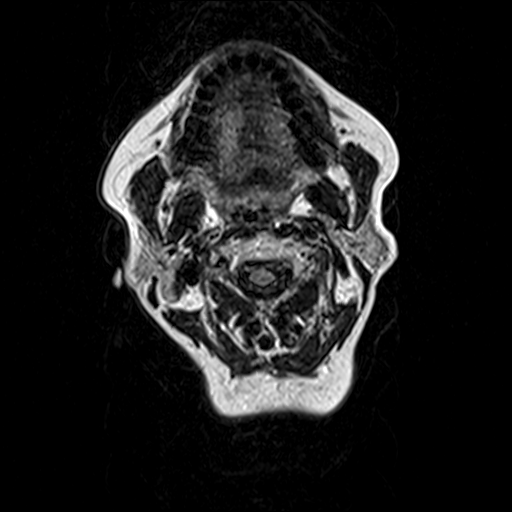
[im 35/35]
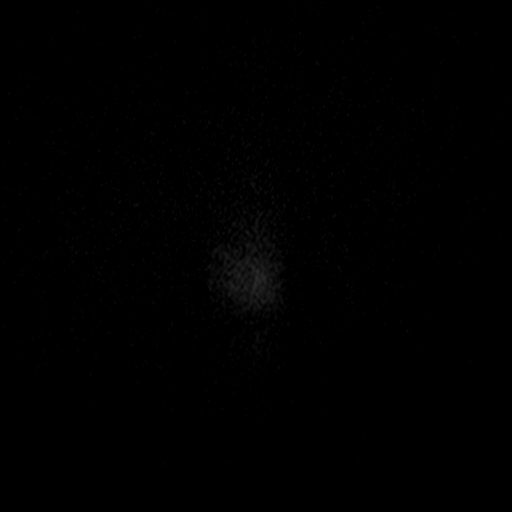

[Series 10: swi_images · axial · 5.0mm · 0.90mm/px · z∈[-40,+115]mm · 2 of 32 slices shown]
[im 1/32]
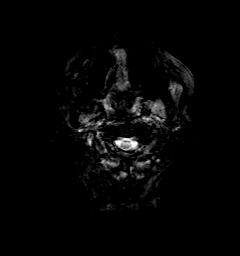
[im 32/32]
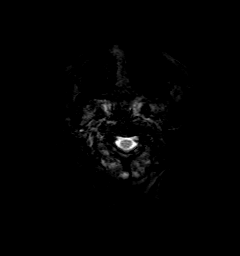

[Series 11: t1_mpr_tra · axial · 1.1mm · 0.75mm/px · z∈[-42,+115]mm · 9 of 144 slices shown (1 of 2)]
[im 1/144]
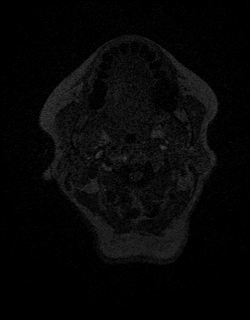
[im 18/144]
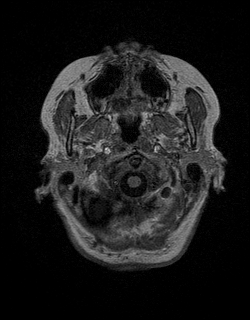
[im 36/144]
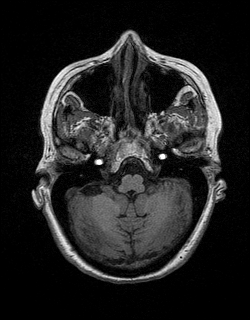
[im 54/144]
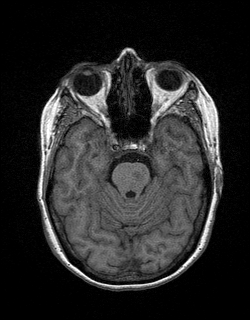
[im 72/144]
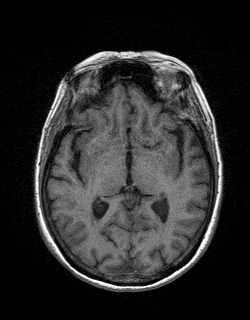
[im 90/144]
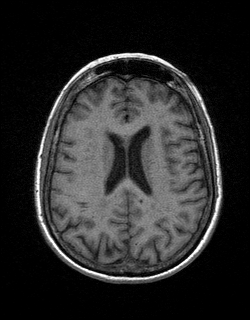
[im 108/144]
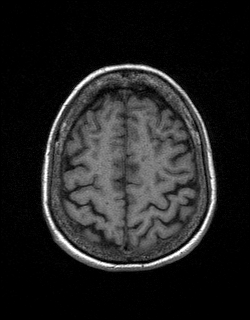
[im 126/144]
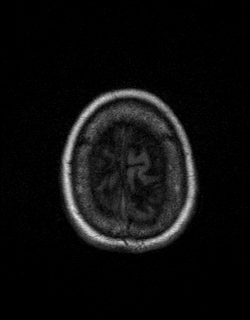
[im 144/144]
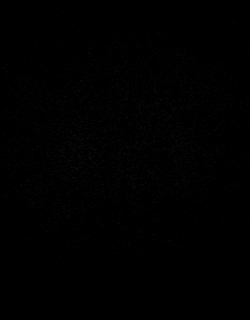

[Series 12: T2 · coronal · 5.0mm · 0.45mm/px · 2 of 31 slices shown (2 of 2)]
[im 1/31]
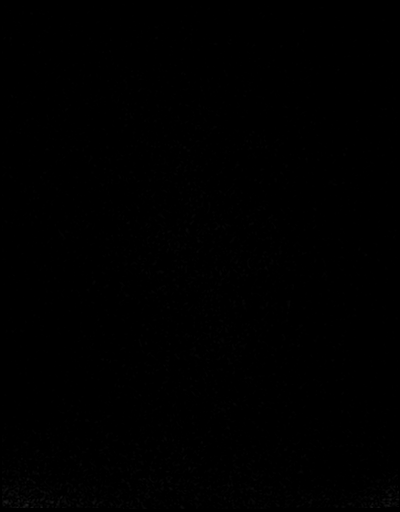
[im 31/31]
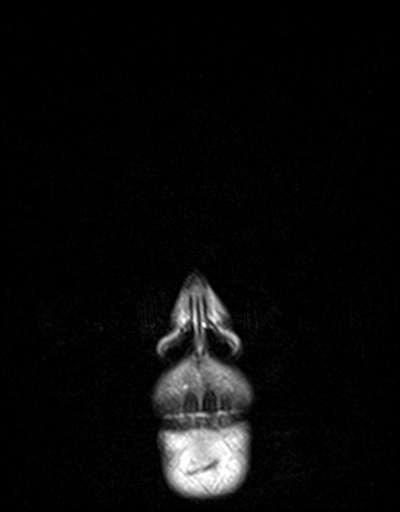

[Series 13: t1_mpr_tra · axial · 1.1mm · 0.75mm/px · z∈[-42,+115]mm · 9 of 144 slices shown (2 of 2)]
[im 1/144]
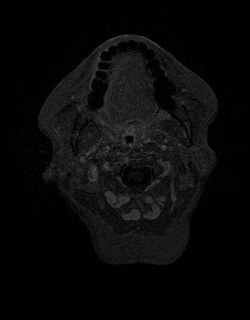
[im 18/144]
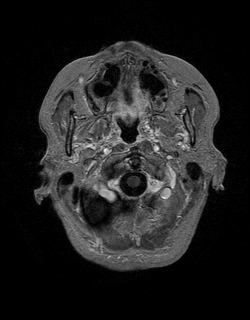
[im 36/144]
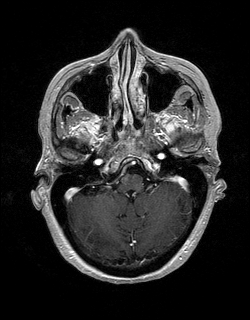
[im 54/144]
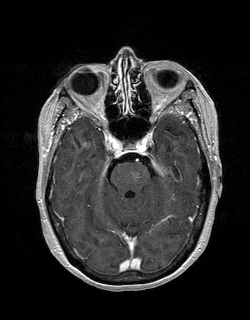
[im 72/144]
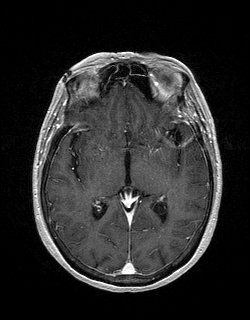
[im 90/144]
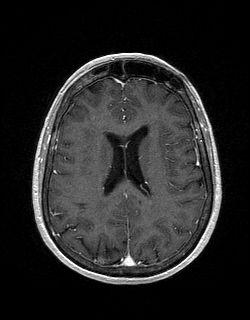
[im 108/144]
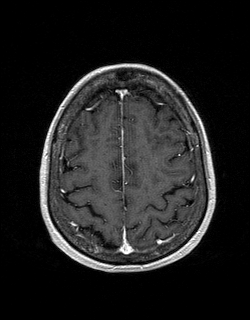
[im 126/144]
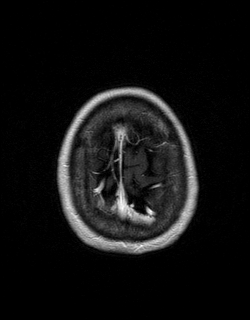
[im 144/144]
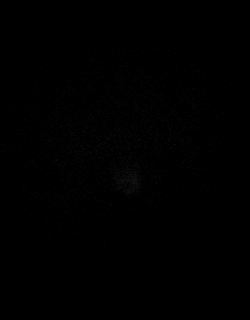

[Series 14: post cor · coronal · 5.0mm · 0.45mm/px · 2 of 31 slices shown]
[im 1/31]
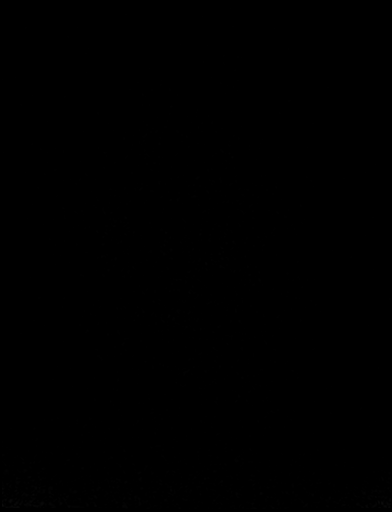
[im 31/31]
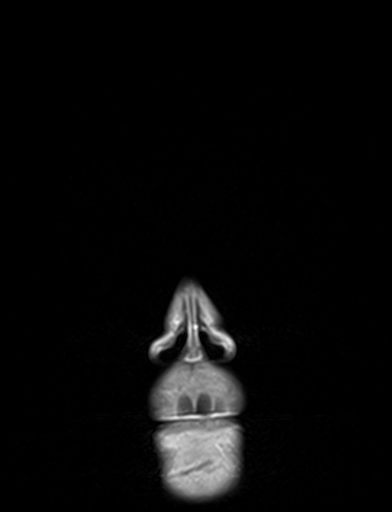

[48 of 48 positions shown; findings below may reference images not displayed]

FINDINGS: Brain: Diffusion imaging does not show any acute or subacute
infarction. The brainstem and cerebellum are normal. Cerebral
hemispheres show mild age related chronic small-vessel ischemic
change of the deep and subcortical white matter. No cortical or
large vessel territory infarction. No mass lesion, hemorrhage,
hydrocephalus or extra-axial collection. After contrast
administration, no abnormal enhancement occurs.

Vascular: Major vessels at the base of the brain show flow.

Skull and upper cervical spine: Negative

Sinuses/Orbits: Clear/normal

Other: None
IMPRESSION: No acute or traumatic finding. Ordinary mild age related chronic
small-vessel ischemic changes of the cerebral hemispheric white
matter.
